# Patient Record
Sex: Female | Born: 1965 | Race: White | Hispanic: No | Marital: Single | Smoking: Never smoker
Health system: Southern US, Community
[De-identification: ages and names within clinical notes are randomized; demographics above are authoritative.]

## PROBLEM LIST (undated history)

## (undated) DIAGNOSIS — D649 Anemia, unspecified: Secondary | ICD-10-CM

## (undated) DIAGNOSIS — R87610 Atypical squamous cells of undetermined significance on cytologic smear of cervix (ASC-US): Secondary | ICD-10-CM

## (undated) DIAGNOSIS — K429 Umbilical hernia without obstruction or gangrene: Secondary | ICD-10-CM

## (undated) DIAGNOSIS — R87810 Cervical high risk human papillomavirus (HPV) DNA test positive: Secondary | ICD-10-CM

## (undated) DIAGNOSIS — N920 Excessive and frequent menstruation with regular cycle: Secondary | ICD-10-CM

## (undated) DIAGNOSIS — D219 Benign neoplasm of connective and other soft tissue, unspecified: Secondary | ICD-10-CM

## (undated) DIAGNOSIS — F419 Anxiety disorder, unspecified: Secondary | ICD-10-CM

## (undated) DIAGNOSIS — Z9289 Personal history of other medical treatment: Secondary | ICD-10-CM

## (undated) HISTORY — DX: Cervical high risk human papillomavirus (HPV) DNA test positive: R87.810

## (undated) HISTORY — DX: Umbilical hernia without obstruction or gangrene: K42.9

## (undated) HISTORY — DX: Personal history of other medical treatment: Z92.89

## (undated) HISTORY — DX: Anxiety disorder, unspecified: F41.9

## (undated) HISTORY — DX: Benign neoplasm of connective and other soft tissue, unspecified: D21.9

## (undated) HISTORY — DX: Excessive and frequent menstruation with regular cycle: N92.0

## (undated) HISTORY — DX: Anemia, unspecified: D64.9

## (undated) HISTORY — DX: Atypical squamous cells of undetermined significance on cytologic smear of cervix (ASC-US): R87.610

---

## 1987-05-17 DIAGNOSIS — O321XX0 Maternal care for breech presentation, not applicable or unspecified: Secondary | ICD-10-CM

## 1995-05-25 DIAGNOSIS — Z98891 History of uterine scar from previous surgery: Secondary | ICD-10-CM

## 2004-10-21 ENCOUNTER — Ambulatory Visit: Payer: Self-pay

## 2007-10-12 ENCOUNTER — Ambulatory Visit: Payer: Self-pay

## 2008-08-30 HISTORY — PX: COLPOSCOPY: SHX161

## 2008-08-30 HISTORY — PX: LEEP: SHX91

## 2008-08-30 HISTORY — PX: COLONOSCOPY: SHX174

## 2008-09-18 DIAGNOSIS — R87610 Atypical squamous cells of undetermined significance on cytologic smear of cervix (ASC-US): Secondary | ICD-10-CM

## 2008-09-18 HISTORY — DX: Atypical squamous cells of undetermined significance on cytologic smear of cervix (ASC-US): R87.610

## 2008-10-15 ENCOUNTER — Ambulatory Visit: Payer: Self-pay

## 2009-04-04 HISTORY — PX: CERVICAL BIOPSY  W/ LOOP ELECTRODE EXCISION: SUR135

## 2009-10-16 ENCOUNTER — Ambulatory Visit: Payer: Self-pay

## 2010-10-27 ENCOUNTER — Ambulatory Visit: Payer: Self-pay

## 2011-10-20 DIAGNOSIS — D649 Anemia, unspecified: Secondary | ICD-10-CM

## 2011-10-20 HISTORY — DX: Anemia, unspecified: D64.9

## 2011-11-30 ENCOUNTER — Ambulatory Visit: Payer: Self-pay

## 2012-08-30 DIAGNOSIS — D219 Benign neoplasm of connective and other soft tissue, unspecified: Secondary | ICD-10-CM

## 2012-08-30 HISTORY — DX: Benign neoplasm of connective and other soft tissue, unspecified: D21.9

## 2012-10-24 DIAGNOSIS — N920 Excessive and frequent menstruation with regular cycle: Secondary | ICD-10-CM

## 2012-10-24 HISTORY — DX: Excessive and frequent menstruation with regular cycle: N92.0

## 2012-10-25 DIAGNOSIS — K429 Umbilical hernia without obstruction or gangrene: Secondary | ICD-10-CM

## 2012-10-25 HISTORY — DX: Umbilical hernia without obstruction or gangrene: K42.9

## 2012-11-28 ENCOUNTER — Ambulatory Visit (INDEPENDENT_AMBULATORY_CARE_PROVIDER_SITE_OTHER): Payer: BC Managed Care – PPO | Admitting: General Surgery

## 2012-11-28 ENCOUNTER — Encounter: Payer: Self-pay | Admitting: General Surgery

## 2012-11-28 VITALS — BP 122/74 | HR 70 | Resp 12 | Ht 63.0 in | Wt 181.0 lb

## 2012-11-28 DIAGNOSIS — K420 Umbilical hernia with obstruction, without gangrene: Secondary | ICD-10-CM

## 2012-11-28 DIAGNOSIS — D259 Leiomyoma of uterus, unspecified: Secondary | ICD-10-CM

## 2012-11-28 DIAGNOSIS — K429 Umbilical hernia without obstruction or gangrene: Secondary | ICD-10-CM

## 2012-11-28 NOTE — Patient Instructions (Signed)
Hernia precautions and incarceration were discussed with the patient. If they develop symptoms of an incarcerated hernia, they were encouraged to seek prompt medical attention.  I have recommended repair of the hernia using mesh on an outpatient basis in the near future. The risk of infection was reviewed. The role of prosthetic mesh to minimize the risk of recurrence was reviewed. 

## 2012-11-28 NOTE — Progress Notes (Signed)
Patient ID: Andrea Lewis, female   DOB: 1966-03-13, 47 y.o.   MRN: [id number]  Chief Complaint  Patient presents with  . Umbilical Hernia    HPI Andrea Lewis is a 47 y.o. female here today for a umbilical hernia. Patient states is been there for 17 years and now it is getting bigger.  HPI  Past Medical History  Diagnosis Date  . Fibroid 2014    Past Surgical History  Procedure Laterality Date  . Cesarean section  1988, 1996  . Leep  2010  . Colonoscopy  2010    Family History  Problem Relation Age of Onset  . Adopted: Yes    Social History History  Substance Use Topics  . Smoking status: Never Smoker   . Smokeless tobacco: Never Used  . Alcohol Use: No    No Known Allergies  Current Outpatient Prescriptions  Medication Sig Dispense Refill  . Multiple Vitamin (MULTIVITAMIN) tablet Take 1 tablet by mouth daily.      Marland Kitchen NORTREL 1/35, 28, tablet Take 1 tablet by mouth daily.       No current facility-administered medications for this visit.    Review of Systems Review of Systems  Constitutional: Negative.   Respiratory: Negative.   Cardiovascular: Negative.     Blood pressure 122/74, pulse 70, resp. rate 12, height 5\' 3"  (1.6 m), weight 181 lb (82.101 kg), last menstrual period 11/07/2012.  Physical Exam Physical Exam  Constitutional: She appears well-developed and well-nourished.  Neck: Normal range of motion. Neck supple.  Cardiovascular: Normal rate, regular rhythm and normal heart sounds.   Pulmonary/Chest: Effort normal and breath sounds normal.  Abdominal: Soft. Normal appearance. A hernia is present. Hernia confirmed positive in the ventral area (sizeable umbilical hernia).  Genitourinary: Uterus is enlarged.    Data Reviewed HGB: 10.3 from GYN record.  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     Plan    Umbilical hernia repair at the time of hysterectomy as appropriate. The potential  for a mesh repair was reviewed. The patient has been encouraged by Dr. Tiburcio Pea to make use of hormonal manipulation to shrink the size of the leiomyoma prior to surgical intervention. Due to cost, this will likely be postponed until June.  Preop assessment will be required and will be coordinated with Dr. Tiburcio Pea.       Andrea Lewis 11/29/2012, 7:42 AM

## 2012-11-29 ENCOUNTER — Encounter: Payer: Self-pay | Admitting: General Surgery

## 2012-11-29 DIAGNOSIS — K429 Umbilical hernia without obstruction or gangrene: Secondary | ICD-10-CM | POA: Insufficient documentation

## 2012-11-29 DIAGNOSIS — D259 Leiomyoma of uterus, unspecified: Secondary | ICD-10-CM | POA: Insufficient documentation

## 2012-12-19 ENCOUNTER — Ambulatory Visit: Payer: Self-pay

## 2013-12-25 DIAGNOSIS — Z9289 Personal history of other medical treatment: Secondary | ICD-10-CM

## 2013-12-25 HISTORY — DX: Personal history of other medical treatment: Z92.89

## 2013-12-25 LAB — HM PAP SMEAR

## 2014-02-21 ENCOUNTER — Ambulatory Visit: Payer: Self-pay

## 2014-07-01 ENCOUNTER — Encounter: Payer: Self-pay | Admitting: General Surgery

## 2015-02-17 ENCOUNTER — Other Ambulatory Visit: Payer: Self-pay | Admitting: Obstetrics and Gynecology

## 2015-02-17 DIAGNOSIS — Z1231 Encounter for screening mammogram for malignant neoplasm of breast: Secondary | ICD-10-CM

## 2015-02-24 ENCOUNTER — Ambulatory Visit
Admission: RE | Admit: 2015-02-24 | Discharge: 2015-02-24 | Disposition: A | Discharge status: Home / Self Care | Payer: BC Managed Care – PPO | Source: Ambulatory Visit | Attending: Obstetrics and Gynecology | Admitting: Obstetrics and Gynecology

## 2015-02-24 DIAGNOSIS — Z1231 Encounter for screening mammogram for malignant neoplasm of breast: Secondary | ICD-10-CM | POA: Diagnosis not present

## 2016-02-26 ENCOUNTER — Other Ambulatory Visit: Payer: Self-pay | Admitting: Obstetrics and Gynecology

## 2016-02-26 DIAGNOSIS — Z1231 Encounter for screening mammogram for malignant neoplasm of breast: Secondary | ICD-10-CM

## 2016-03-12 ENCOUNTER — Ambulatory Visit
Admission: RE | Admit: 2016-03-12 | Discharge: 2016-03-12 | Disposition: A | Discharge status: Home / Self Care | Payer: BC Managed Care – PPO | Source: Ambulatory Visit | Attending: Obstetrics and Gynecology | Admitting: Obstetrics and Gynecology

## 2016-03-12 DIAGNOSIS — Z1231 Encounter for screening mammogram for malignant neoplasm of breast: Secondary | ICD-10-CM | POA: Diagnosis present

## 2016-03-12 LAB — HM MAMMOGRAPHY

## 2016-11-11 ENCOUNTER — Telehealth: Payer: Self-pay

## 2016-11-11 NOTE — Telephone Encounter (Signed)
Pt just scheduled AE 01/27/17. She is out of refills on her Nortel. She uses [facility] in [place].

## 2016-11-12 NOTE — Telephone Encounter (Signed)
Patient came into office about refill. Andrea Lewis said she will send it today. Please double check this has been done. Please advise.

## 2016-11-16 ENCOUNTER — Other Ambulatory Visit: Payer: Self-pay

## 2016-11-16 DIAGNOSIS — Z308 Encounter for other contraceptive management: Secondary | ICD-10-CM | POA: Insufficient documentation

## 2016-11-16 MED ORDER — NORETHINDRONE-ETH ESTRADIOL 1-35 MG-MCG PO TABS: 1.0000 | ORAL_TABLET | Freq: Every day | ORAL | 2 refills | Status: DC

## 2016-11-16 NOTE — Telephone Encounter (Signed)
Refill has been sent in, called pt but no answer and no voicemail set up to leave message .

## 2016-11-17 NOTE — Telephone Encounter (Signed)
Pt states she  got her [facility]

## 2017-01-27 ENCOUNTER — Encounter: Payer: Self-pay | Admitting: Obstetrics and Gynecology

## 2017-01-27 ENCOUNTER — Ambulatory Visit (INDEPENDENT_AMBULATORY_CARE_PROVIDER_SITE_OTHER): Payer: BC Managed Care – PPO | Admitting: Obstetrics and Gynecology

## 2017-01-27 VITALS — BP 112/66 | HR 75 | Ht 63.0 in | Wt 158.0 lb

## 2017-01-27 DIAGNOSIS — K429 Umbilical hernia without obstruction or gangrene: Secondary | ICD-10-CM

## 2017-01-27 DIAGNOSIS — Z1239 Encounter for other screening for malignant neoplasm of breast: Secondary | ICD-10-CM

## 2017-01-27 DIAGNOSIS — Z01419 Encounter for gynecological examination (general) (routine) without abnormal findings: Secondary | ICD-10-CM

## 2017-01-27 DIAGNOSIS — Z1151 Encounter for screening for human papillomavirus (HPV): Secondary | ICD-10-CM | POA: Diagnosis not present

## 2017-01-27 DIAGNOSIS — Z13220 Encounter for screening for lipoid disorders: Secondary | ICD-10-CM

## 2017-01-27 DIAGNOSIS — Z0000 Encounter for general adult medical examination without abnormal findings: Secondary | ICD-10-CM

## 2017-01-27 DIAGNOSIS — D219 Benign neoplasm of connective and other soft tissue, unspecified: Secondary | ICD-10-CM

## 2017-01-27 DIAGNOSIS — Z3041 Encounter for surveillance of contraceptive pills: Secondary | ICD-10-CM

## 2017-01-27 DIAGNOSIS — Z124 Encounter for screening for malignant neoplasm of cervix: Secondary | ICD-10-CM

## 2017-01-27 DIAGNOSIS — Z1231 Encounter for screening mammogram for malignant neoplasm of breast: Secondary | ICD-10-CM | POA: Diagnosis not present

## 2017-01-27 MED ORDER — NORETHINDRONE-ETH ESTRADIOL 1-35 MG-MCG PO TABS: 1.0000 | ORAL_TABLET | Freq: Every day | ORAL | 12 refills | Status: DC

## 2017-01-27 NOTE — Progress Notes (Signed)
Chief Complaint  Patient presents with  . Gynecologic Exam     HPI:      [name] is a 51 y.o. G2P2002 who LMP was No LMP recorded., presents today for her annual examination.  Her menses are absent due to continuous dosing of OCPs for cycle control.  Dysmenorrhea none. She does have occas intermenstrual bleeding. 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 She saw Dr. Kenton Kingfisher in 2014 who recommended lupron to shrink the fibroids before hyst, but that was too expensive. We ordered Lupron last yr with a coupon card but pt decided against using it. Her bleeding is controlled with OCPs. There is concern about the size of her uterus.    Sex activity: not sexually active.  Last Pap: December 25, 2013  Results were: no abnormalities /neg HPV DNA . 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  Last mammogram: March 12, 2016  Results were: normal--routine follow-up in 12 months FH unknown due to adoption. The patient does not do self-breast exams.  Tobacco use: The patient denies current or previous tobacco use. Alcohol use: none Exercise: not active  She does not get adequate calcium and Vitamin D in her diet.  She had borderline lipids, anemia, and Vit D deficiency on 2017 labs, but pt not interested in lipid meds, and doesn't remember Fe and Vit D supp. She has lost wt with wt watchers.  Past Medical History:  Diagnosis Date  . Anemia 10/20/2011   d/t chronic blood loss  . Anxiety   . ASCUS with positive high risk HPV cervical 09/18/2008   ascus; hpv +  . Fibroid 2014   c menorrhagia  . History of mammography, diagnostic 02/24/2015; 03/12/2016   BIRAD 2; NEG  . History of Papanicolaou smear of cervix 12/25/2013   -/-  . Menorrhagia 10/24/2012  . Umbilical hernia 37/85/8850   REDUCIBLE    Past Surgical History:  Procedure Laterality Date  . CERVICAL BIOPSY  W/ LOOP ELECTRODE EXCISION   04/04/2009  . [place]  . COLONOSCOPY  2010  . 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 LEEP  2010    Family History  Problem Relation Age of Onset  . Adopted: Yes    Social History   Social History  . Marital status: Single    Spouse name: N/A  . Number of children: N/A  . Years of education: N/A   Occupational History  . Not on file.   Social History Main Topics  . Smoking status: Never Smoker  . Smokeless tobacco: Never Used  . Alcohol use No  . Drug use: No  . Sexual activity: Not Currently    Birth control/ protection: Pill   Other Topics Concern  . Not on file   Social History Narrative  . No narrative on file     Current Outpatient Prescriptions:  Marland Kitchen  Multiple Vitamin (MULTIVITAMIN) tablet, Take 1 tablet by mouth daily., Disp: , Rfl:  .  norethindrone-ethinyl estradiol 1/35 (NORTREL 1/35, 28,) tablet, Take 1 tablet by mouth daily., Disp: 1 Package, Rfl: 12  ROS:  Review of Systems  Constitutional: Negative for fatigue, fever and unexpected weight change.  Respiratory: Negative for cough, shortness of breath and wheezing.   Cardiovascular: Negative for chest pain, palpitations and leg swelling.  Gastrointestinal: Positive for constipation. Negative for blood  in stool, diarrhea, nausea and vomiting.  Endocrine: Negative for cold intolerance, heat intolerance and polyuria.  Genitourinary: Negative for dyspareunia, dysuria, flank pain, frequency, genital sores, hematuria, menstrual problem, pelvic pain, urgency, vaginal bleeding, vaginal discharge and vaginal pain.  Musculoskeletal: Negative for back pain, joint swelling and myalgias.  Skin: Negative for rash.  Neurological: Negative for dizziness, syncope, light-headedness, numbness and headaches.  Hematological: Negative for adenopathy.  Psychiatric/Behavioral: Negative for agitation, confusion, sleep disturbance and suicidal ideas. The patient is not nervous/anxious.      Objective: BP 112/66  (BP Location: Left Arm, Patient Position: Sitting, Cuff Size: Normal)   Pulse 75   Ht 5\' 3"  (1.6 m)   Wt 158 lb (71.7 kg)   BMI 27.99 kg/m    Physical Exam  Constitutional: She is oriented to person, place, and time. She appears well-developed and well-nourished.  Genitourinary: Vagina normal. There is no rash or tenderness on the right labia. There is no rash or tenderness on the left labia. No erythema or tenderness in the vagina. No vaginal discharge found. Right adnexum does not display mass and does not display tenderness. Left adnexum does not display mass and does not display tenderness. Cervix does not exhibit motion tenderness or polyp.   Uterus is enlarged, exhibiting a mass and irregular. Uterus is not tender.  Neck: Normal range of motion. No thyromegaly present.  Cardiovascular: Normal rate, regular rhythm and normal heart sounds.   No murmur heard. Pulmonary/Chest: Effort normal and breath sounds normal. Right breast exhibits no mass, no nipple discharge, no skin change and no tenderness. Left breast exhibits no mass, no nipple discharge, no skin change and no tenderness.  Abdominal: There is no tenderness. There is no guarding. A hernia is present. Hernia confirmed positive in the ventral area.    Musculoskeletal: Normal range of motion.  Neurological: She is alert and oriented to person, place, and time. No cranial nerve deficit.  Psychiatric: She has a normal mood and affect. Her behavior is normal.  Vitals reviewed.   Assessment/Plan: Encounter for annual routine gynecological examination  Cervical cancer screening - Plan: IGP, Aptima HPV  Screening for HPV (human papillomavirus) - Plan: IGP, Aptima HPV  Screening for breast cancer - Pt to sched mammo. - Plan: MM DIGITAL SCREENING BILATERAL  Encounter for surveillance of contraceptive pills - OCP RF eRxd. Continue continuous dosing for cycle control. - Plan: norethindrone-ethinyl estradiol 1/35 (NORTREL 1/35, 28,)  tablet  Screening cholesterol level - Will f/u with results. Pt has lost wt with diet changes.  Blood tests for routine general physical examination - Plan: CBC with Differential/Platelet, Lipid panel  Leiomyoma - Check u/s for size/stability. Pt encouraged to have removed or try lupron to decrease size in past, but pt not interested.  - Plan: US Transvaginal Non-OB  Umbilical hernia without obstruction and without gangrene - Pt encouraged to have repaired but not interested.              GYN counsel mammography screening, adequate intake of calcium and vitamin D, diet and exercise     F/U  Return in about 1 week (around 02/03/2017) for GYN u/s for leio--ABC to call pt.  Takeshi Teasdale B. Kitzia Camus, PA-C 01/27/2017 5:22 PM

## 2017-01-28 ENCOUNTER — Telehealth: Payer: Self-pay | Admitting: Obstetrics and Gynecology

## 2017-01-28 NOTE — Telephone Encounter (Signed)
Pt is schedule 02/04/17

## 2017-01-28 NOTE — Telephone Encounter (Signed)
-----   Message from Chad Cordial, PA-C sent at 01/27/2017  5:18 PM EDT ----- Regarding: U/S appt Can you pls call pt to sched GYN u/s appt (forgot to mention when she checked out). Pt also will do fasting labs that day so sched in AM. ABC to call pt with results. Thank you.

## 2017-02-02 LAB — IGP, APTIMA HPV
HPV Aptima: NEGATIVE
PAP Smear Comment: 0

## 2017-02-04 ENCOUNTER — Other Ambulatory Visit: Payer: BC Managed Care – PPO

## 2017-02-10 ENCOUNTER — Ambulatory Visit (INDEPENDENT_AMBULATORY_CARE_PROVIDER_SITE_OTHER): Payer: BC Managed Care – PPO

## 2017-02-10 ENCOUNTER — Other Ambulatory Visit: Payer: BC Managed Care – PPO

## 2017-02-10 ENCOUNTER — Telehealth: Payer: Self-pay | Admitting: Obstetrics and Gynecology

## 2017-02-10 DIAGNOSIS — Z0000 Encounter for general adult medical examination without abnormal findings: Secondary | ICD-10-CM

## 2017-02-10 DIAGNOSIS — D219 Benign neoplasm of connective and other soft tissue, unspecified: Secondary | ICD-10-CM | POA: Diagnosis not present

## 2017-02-10 NOTE — Telephone Encounter (Signed)
Pt aware of large stable fibroid on u/s. No real change in size. 6.4 x 8.4 x 8 cm. Pt doesn't want to have surg even though that is recommendation. Follow expectantly.

## 2017-02-11 LAB — LIPID PANEL
Chol/HDL Ratio: 4 ratio (ref 0.0–4.4)
Cholesterol, Total: 182 mg/dL (ref 100–199)
HDL: 46 mg/dL (ref >39)
LDL Calculated: 108 mg/dL — ABNORMAL HIGH (ref 0–99)
Triglycerides: 141 mg/dL (ref 0–149)
VLDL Cholesterol Cal: 28 mg/dL (ref 5–40)

## 2017-02-11 LAB — CBC WITH DIFFERENTIAL/PLATELET
BASOS ABS: 0 10*3/uL (ref 0.0–0.2)
Basos: 1 %
EOS (ABSOLUTE): 0.2 10*3/uL (ref 0.0–0.4)
Eos: 5 %
Hematocrit: 35.4 % (ref 34.0–46.6)
Hemoglobin: 10.8 g/dL — ABNORMAL LOW (ref 11.1–15.9)
IMMATURE GRANULOCYTES: 0 %
Immature Grans (Abs): 0 10*3/uL (ref 0.0–0.1)
LYMPHS: 36 %
Lymphocytes Absolute: 1.5 10*3/uL (ref 0.7–3.1)
MCH: 25.6 pg — ABNORMAL LOW (ref 26.6–33.0)
MCHC: 30.5 g/dL — ABNORMAL LOW (ref 31.5–35.7)
MCV: 84 fL (ref 79–97)
MONOS ABS: 0.4 10*3/uL (ref 0.1–0.9)
Monocytes: 11 %
NEUTROS PCT: 47 %
Neutrophils Absolute: 1.9 10*3/uL (ref 1.4–7.0)
PLATELETS: 287 10*3/uL (ref 150–379)
RBC: 4.22 x10E6/uL (ref 3.77–5.28)
RDW: 15.8 % — AB (ref 12.3–15.4)
WBC: 4 10*3/uL (ref 3.4–10.8)

## 2017-02-11 NOTE — Progress Notes (Signed)
Fibroids increasing in size.  Still recommend Lupron and/or surgery.  Review of ULTRASOUND.    I have personally reviewed images and report of recent ultrasound done at [facility].    Plan of management to be discussed with patient.  Barnett Applebaum, MD, [facility], [facility] 02/11/2017  7:58 AM

## 2017-02-24 ENCOUNTER — Other Ambulatory Visit: Payer: Self-pay | Admitting: Obstetrics and Gynecology

## 2017-02-25 ENCOUNTER — Other Ambulatory Visit: Payer: Self-pay | Admitting: Obstetrics and Gynecology

## 2017-02-25 MED ORDER — ONE-DAILY MULTI VITAMINS PO TABS: 1.0000 | ORAL_TABLET | Freq: Every day | ORAL | 4 refills | Status: AC

## 2017-02-25 NOTE — Telephone Encounter (Signed)
Pt called for refill on Nortrell.  Call [facility] [place].  They have refills on file.  UTR pt.  [phone].

## 2017-02-25 NOTE — Telephone Encounter (Signed)
Pharm called for verification of nortrel dose.  Pt states it's supposed to be 0.5/35 and it gets mixed up all the time.  Last note says 1/35.  Adv pharm of this but pt is adamant that it is 0.5/35.  Adv pharm to dispense 0.5/35.   [facility] has 0.5/35 as well.

## 2017-03-16 ENCOUNTER — Ambulatory Visit
Admission: RE | Admit: 2017-03-16 | Discharge: 2017-03-16 | Disposition: A | Discharge status: Home / Self Care | Payer: BC Managed Care – PPO | Source: Ambulatory Visit | Attending: Obstetrics and Gynecology | Admitting: Obstetrics and Gynecology

## 2017-03-16 DIAGNOSIS — Z1231 Encounter for screening mammogram for malignant neoplasm of breast: Secondary | ICD-10-CM | POA: Diagnosis not present

## 2017-03-16 DIAGNOSIS — Z1239 Encounter for other screening for malignant neoplasm of breast: Secondary | ICD-10-CM

## 2017-03-17 ENCOUNTER — Encounter: Payer: Self-pay | Admitting: Obstetrics and Gynecology

## 2017-06-20 ENCOUNTER — Telehealth: Payer: Self-pay | Admitting: *Deleted

## 2017-06-20 ENCOUNTER — Encounter
Admission: EM | Disposition: A | Discharge status: Home / Self Care | Payer: Self-pay | Source: Home / Self Care | Attending: Emergency Medicine

## 2017-06-20 ENCOUNTER — Telehealth: Payer: Self-pay

## 2017-06-20 ENCOUNTER — Emergency Department: Payer: BC Managed Care – PPO | Admitting: Anesthesiology

## 2017-06-20 ENCOUNTER — Encounter: Payer: Self-pay | Admitting: Emergency Medicine

## 2017-06-20 ENCOUNTER — Emergency Department: Payer: BC Managed Care – PPO

## 2017-06-20 ENCOUNTER — Observation Stay
Admission: EM | Admit: 2017-06-20 | Discharge: 2017-06-21 | Disposition: A | Discharge status: Home / Self Care | Payer: BC Managed Care – PPO | Attending: Surgery | Admitting: Surgery

## 2017-06-20 DIAGNOSIS — D259 Leiomyoma of uterus, unspecified: Secondary | ICD-10-CM | POA: Insufficient documentation

## 2017-06-20 DIAGNOSIS — Z9889 Other specified postprocedural states: Secondary | ICD-10-CM | POA: Insufficient documentation

## 2017-06-20 DIAGNOSIS — K420 Umbilical hernia with obstruction, without gangrene: Principal | ICD-10-CM | POA: Insufficient documentation

## 2017-06-20 DIAGNOSIS — Z793 Long term (current) use of hormonal contraceptives: Secondary | ICD-10-CM | POA: Diagnosis not present

## 2017-06-20 DIAGNOSIS — K429 Umbilical hernia without obstruction or gangrene: Secondary | ICD-10-CM

## 2017-06-20 DIAGNOSIS — K8020 Calculus of gallbladder without cholecystitis without obstruction: Secondary | ICD-10-CM | POA: Insufficient documentation

## 2017-06-20 DIAGNOSIS — R112 Nausea with vomiting, unspecified: Secondary | ICD-10-CM | POA: Diagnosis present

## 2017-06-20 DIAGNOSIS — R932 Abnormal findings on diagnostic imaging of liver and biliary tract: Secondary | ICD-10-CM | POA: Insufficient documentation

## 2017-06-20 HISTORY — PX: UMBILICAL HERNIA REPAIR: SHX196

## 2017-06-20 LAB — URINALYSIS, COMPLETE (UACMP) WITH MICROSCOPIC
BACTERIA UA: NONE SEEN
Bilirubin Urine: NEGATIVE
Glucose, UA: NEGATIVE mg/dL
Ketones, ur: 5 mg/dL — AB
Leukocytes, UA: NEGATIVE
Nitrite: NEGATIVE
Protein, ur: 100 mg/dL — AB
SPECIFIC GRAVITY, URINE: 1.032 — AB (ref 1.005–1.030)
pH: 5 (ref 5.0–8.0)

## 2017-06-20 LAB — CBC
HEMATOCRIT: 44.8 % (ref 35.0–47.0)
Hemoglobin: 14.5 g/dL (ref 12.0–16.0)
MCH: 26.6 pg (ref 26.0–34.0)
MCHC: 32.4 g/dL (ref 32.0–36.0)
MCV: 81.9 fL (ref 80.0–100.0)
Platelets: 387 10*3/uL (ref 150–440)
RBC: 5.47 MIL/uL — ABNORMAL HIGH (ref 3.80–5.20)
RDW: 15.7 % — AB (ref 11.5–14.5)
WBC: 10.3 10*3/uL (ref 3.6–11.0)

## 2017-06-20 LAB — COMPREHENSIVE METABOLIC PANEL
ALBUMIN: 4.1 g/dL (ref 3.5–5.0)
ALT: 16 U/L (ref 14–54)
AST: 19 U/L (ref 15–41)
Alkaline Phosphatase: 62 U/L (ref 38–126)
Anion gap: 12 (ref 5–15)
BUN: 23 mg/dL — AB (ref 6–20)
CHLORIDE: 102 mmol/L (ref 101–111)
CO2: 26 mmol/L (ref 22–32)
Calcium: 10.2 mg/dL (ref 8.9–10.3)
Creatinine, Ser: 0.76 mg/dL (ref 0.44–1.00)
GFR calc Af Amer: >60 mL/min (ref >60)
GFR calc non Af Amer: >60 mL/min (ref >60)
GLUCOSE: 128 mg/dL — AB (ref 65–99)
POTASSIUM: 4.9 mmol/L (ref 3.5–5.1)
Sodium: 140 mmol/L (ref 135–145)
Total Bilirubin: 0.9 mg/dL (ref 0.3–1.2)
Total Protein: 8.3 g/dL — ABNORMAL HIGH (ref 6.5–8.1)

## 2017-06-20 LAB — PROTIME-INR
INR: 1.09
PROTHROMBIN TIME: 14 s (ref 11.4–15.2)

## 2017-06-20 LAB — LIPASE, BLOOD: LIPASE: 20 U/L (ref 11–51)

## 2017-06-20 LAB — PREGNANCY, URINE: Preg Test, Ur: NEGATIVE

## 2017-06-20 LAB — APTT: aPTT: <24 seconds — ABNORMAL LOW (ref 24–36)

## 2017-06-20 LAB — TYPE AND SCREEN
ABO/RH(D): A POS
Antibody Screen: NEGATIVE

## 2017-06-20 SURGERY — REPAIR, HERNIA, UMBILICAL, ADULT
Anesthesia: General | Site: Abdomen | Wound class: Clean

## 2017-06-20 MED ORDER — BUPIVACAINE LIPOSOME 1.3 % IJ SUSP
INTRAMUSCULAR | Status: AC
  Filled 2017-06-20: qty 20

## 2017-06-20 MED ORDER — ROCURONIUM BROMIDE 50 MG/5ML IV SOLN
INTRAVENOUS | Status: AC
  Filled 2017-06-20: qty 1

## 2017-06-20 MED ORDER — MIDAZOLAM HCL 2 MG/2ML IJ SOLN
INTRAMUSCULAR | Status: AC
  Filled 2017-06-20: qty 2

## 2017-06-20 MED ORDER — FENTANYL CITRATE (PF) 100 MCG/2ML IJ SOLN
INTRAMUSCULAR | Status: AC
  Filled 2017-06-20: qty 2

## 2017-06-20 MED ORDER — DEXAMETHASONE SODIUM PHOSPHATE 10 MG/ML IJ SOLN
INTRAMUSCULAR | Status: AC
  Filled 2017-06-20: qty 1

## 2017-06-20 MED ORDER — LIDOCAINE HCL (PF) 2 % IJ SOLN
INTRAMUSCULAR | Status: AC
  Filled 2017-06-20: qty 10

## 2017-06-20 MED ORDER — SUCCINYLCHOLINE CHLORIDE 20 MG/ML IJ SOLN
INTRAMUSCULAR | Status: AC
  Filled 2017-06-20: qty 1

## 2017-06-20 MED ORDER — KETOROLAC TROMETHAMINE 30 MG/ML IJ SOLN
INTRAMUSCULAR | Status: AC
  Filled 2017-06-20: qty 1

## 2017-06-20 MED ORDER — CEFAZOLIN SODIUM-DEXTROSE 2-4 GM/100ML-% IV SOLN
2.0000 g | INTRAVENOUS | Status: AC
  Administered 2017-06-20: 2 g via INTRAVENOUS

## 2017-06-20 MED ORDER — ACETAMINOPHEN 500 MG PO TABS
1000.0000 mg | ORAL_TABLET | Freq: Four times a day (QID) | ORAL | Status: DC
  Administered 2017-06-21 (×3): 1000 mg via ORAL
  Filled 2017-06-20 (×3): qty 2

## 2017-06-20 MED ORDER — ONDANSETRON HCL 4 MG/2ML IJ SOLN
INTRAMUSCULAR | Status: DC | PRN
Start: 2017-06-20 — End: 2017-06-20
  Administered 2017-06-20: 4 mg via INTRAVENOUS

## 2017-06-20 MED ORDER — PANTOPRAZOLE SODIUM 40 MG IV SOLR
40.0000 mg | Freq: Every day | INTRAVENOUS | Status: DC
  Administered 2017-06-20: 40 mg via INTRAVENOUS
  Filled 2017-06-20 (×3): qty 40

## 2017-06-20 MED ORDER — IOPAMIDOL (ISOVUE-300) INJECTION 61%: 30.0000 mL | Freq: Once | INTRAVENOUS | Status: DC | PRN

## 2017-06-20 MED ORDER — BUPIVACAINE HCL (PF) 0.5 % IJ SOLN
INTRAMUSCULAR | Status: AC
  Filled 2017-06-20: qty 30

## 2017-06-20 MED ORDER — PROPOFOL 10 MG/ML IV BOLUS
INTRAVENOUS | Status: DC | PRN
  Administered 2017-06-20: 150 mg via INTRAVENOUS

## 2017-06-20 MED ORDER — SODIUM CHLORIDE 0.9 % IV BOLUS (SEPSIS)
1000.0000 mL | Freq: Once | INTRAVENOUS | Status: AC
  Administered 2017-06-20: 1000 mL via INTRAVENOUS

## 2017-06-20 MED ORDER — HYDROMORPHONE HCL 1 MG/ML IJ SOLN
0.5000 mg | Freq: Once | INTRAMUSCULAR | Status: AC
  Administered 2017-06-20: 0.5 mg via INTRAVENOUS

## 2017-06-20 MED ORDER — LIDOCAINE HCL (CARDIAC) 20 MG/ML IV SOLN
INTRAVENOUS | Status: DC | PRN
  Administered 2017-06-20: 60 mg via INTRAVENOUS

## 2017-06-20 MED ORDER — ONDANSETRON 4 MG PO TBDP: 4.0000 mg | ORAL_TABLET | Freq: Four times a day (QID) | ORAL | Status: DC | PRN

## 2017-06-20 MED ORDER — CHLORHEXIDINE GLUCONATE CLOTH 2 % EX PADS: 6.0000 | MEDICATED_PAD | Freq: Once | CUTANEOUS | Status: DC

## 2017-06-20 MED ORDER — POLYETHYLENE GLYCOL 3350 17 G PO PACK
17.0000 g | PACK | Freq: Every day | ORAL | Status: DC | PRN
  Filled 2017-06-20: qty 1

## 2017-06-20 MED ORDER — IOPAMIDOL (ISOVUE-300) INJECTION 61%
100.0000 mL | Freq: Once | INTRAVENOUS | Status: AC | PRN
  Administered 2017-06-20: 100 mL via INTRAVENOUS

## 2017-06-20 MED ORDER — FENTANYL CITRATE (PF) 100 MCG/2ML IJ SOLN
INTRAMUSCULAR | Status: DC | PRN
  Administered 2017-06-20: 100 ug via INTRAVENOUS
  Administered 2017-06-20: 25 ug via INTRAVENOUS
  Administered 2017-06-20: 50 ug via INTRAVENOUS
  Administered 2017-06-20: 25 ug via INTRAVENOUS
  Administered 2017-06-20: 50 ug via INTRAVENOUS

## 2017-06-20 MED ORDER — BUPIVACAINE LIPOSOME 1.3 % IJ SUSP
INTRAMUSCULAR | Status: DC | PRN
  Administered 2017-06-20: 20 mL

## 2017-06-20 MED ORDER — CEFAZOLIN SODIUM 1 G IJ SOLR
INTRAMUSCULAR | Status: AC
  Filled 2017-06-20: qty 20

## 2017-06-20 MED ORDER — EPHEDRINE SULFATE 50 MG/ML IJ SOLN
INTRAMUSCULAR | Status: AC
  Filled 2017-06-20: qty 1

## 2017-06-20 MED ORDER — SUGAMMADEX SODIUM 200 MG/2ML IV SOLN
INTRAVENOUS | Status: DC | PRN
  Administered 2017-06-20: 150 mg via INTRAVENOUS

## 2017-06-20 MED ORDER — DEXAMETHASONE SODIUM PHOSPHATE 10 MG/ML IJ SOLN
INTRAMUSCULAR | Status: DC | PRN
  Administered 2017-06-20: 10 mg via INTRAVENOUS

## 2017-06-20 MED ORDER — FENTANYL CITRATE (PF) 100 MCG/2ML IJ SOLN: 25.0000 ug | INTRAMUSCULAR | Status: DC | PRN

## 2017-06-20 MED ORDER — LACTATED RINGERS IV SOLN
INTRAVENOUS | Status: DC | PRN
  Administered 2017-06-20 (×2): via INTRAVENOUS

## 2017-06-20 MED ORDER — ONDANSETRON HCL 4 MG/2ML IJ SOLN
INTRAMUSCULAR | Status: AC
  Filled 2017-06-20: qty 2

## 2017-06-20 MED ORDER — SUGAMMADEX SODIUM 200 MG/2ML IV SOLN
INTRAVENOUS | Status: AC
  Filled 2017-06-20: qty 2

## 2017-06-20 MED ORDER — ACETAMINOPHEN 10 MG/ML IV SOLN
INTRAVENOUS | Status: AC
  Filled 2017-06-20: qty 100

## 2017-06-20 MED ORDER — LACTATED RINGERS IV SOLN
125.0000 mL/h | INTRAVENOUS | Status: DC
  Administered 2017-06-20: 125 mL/h via INTRAVENOUS

## 2017-06-20 MED ORDER — ACETAMINOPHEN 10 MG/ML IV SOLN
INTRAVENOUS | Status: DC | PRN
  Administered 2017-06-20: 1000 mg via INTRAVENOUS

## 2017-06-20 MED ORDER — PHENYLEPHRINE HCL 10 MG/ML IJ SOLN
INTRAMUSCULAR | Status: DC | PRN
  Administered 2017-06-20 (×2): 100 ug via INTRAVENOUS

## 2017-06-20 MED ORDER — KETOROLAC TROMETHAMINE 30 MG/ML IJ SOLN
INTRAMUSCULAR | Status: DC | PRN
  Administered 2017-06-20: 30 mg via INTRAVENOUS

## 2017-06-20 MED ORDER — ROCURONIUM BROMIDE 100 MG/10ML IV SOLN
INTRAVENOUS | Status: DC | PRN
  Administered 2017-06-20: 30 mg via INTRAVENOUS
  Administered 2017-06-20: 20 mg via INTRAVENOUS

## 2017-06-20 MED ORDER — PHENYLEPHRINE HCL 10 MG/ML IJ SOLN
INTRAMUSCULAR | Status: AC
  Filled 2017-06-20: qty 1

## 2017-06-20 MED ORDER — ENOXAPARIN SODIUM 40 MG/0.4ML ~~LOC~~ SOLN
40.0000 mg | SUBCUTANEOUS | Status: DC
  Administered 2017-06-21: 40 mg via SUBCUTANEOUS
  Filled 2017-06-20 (×3): qty 0.4

## 2017-06-20 MED ORDER — PROMETHAZINE HCL 25 MG/ML IJ SOLN: 6.2500 mg | INTRAMUSCULAR | Status: DC | PRN

## 2017-06-20 MED ORDER — ONDANSETRON HCL 4 MG/2ML IJ SOLN: 4.0000 mg | Freq: Four times a day (QID) | INTRAMUSCULAR | Status: DC | PRN

## 2017-06-20 MED ORDER — HYDROMORPHONE HCL 1 MG/ML IJ SOLN
0.5000 mg | Freq: Once | INTRAMUSCULAR | Status: AC
  Administered 2017-06-20: 0.5 mg via INTRAVENOUS
  Filled 2017-06-20: qty 1

## 2017-06-20 MED ORDER — KETOROLAC TROMETHAMINE 30 MG/ML IJ SOLN
30.0000 mg | Freq: Four times a day (QID) | INTRAMUSCULAR | Status: DC
  Administered 2017-06-21 (×3): 30 mg via INTRAVENOUS
  Filled 2017-06-20 (×10): qty 1

## 2017-06-20 MED ORDER — HYDROMORPHONE HCL 1 MG/ML IJ SOLN
INTRAMUSCULAR | Status: AC
  Administered 2017-06-20: 0.5 mg via INTRAVENOUS
  Filled 2017-06-20: qty 1

## 2017-06-20 MED ORDER — HYDROMORPHONE HCL 1 MG/ML IJ SOLN: 0.5000 mg | INTRAMUSCULAR | Status: DC | PRN

## 2017-06-20 MED ORDER — SEVOFLURANE IN SOLN
RESPIRATORY_TRACT | Status: AC
  Filled 2017-06-20: qty 250

## 2017-06-20 MED ORDER — EPHEDRINE SULFATE 50 MG/ML IJ SOLN
INTRAMUSCULAR | Status: DC | PRN
  Administered 2017-06-20 (×2): 5 mg via INTRAVENOUS

## 2017-06-20 MED ORDER — PROPOFOL 10 MG/ML IV BOLUS
INTRAVENOUS | Status: AC
  Filled 2017-06-20: qty 20

## 2017-06-20 MED ORDER — ONDANSETRON HCL 4 MG/2ML IJ SOLN
4.0000 mg | Freq: Once | INTRAMUSCULAR | Status: AC
  Administered 2017-06-20: 4 mg via INTRAVENOUS
  Filled 2017-06-20: qty 2

## 2017-06-20 MED ORDER — HYDROMORPHONE HCL 1 MG/ML IJ SOLN
INTRAMUSCULAR | Status: AC
  Filled 2017-06-20: qty 1

## 2017-06-20 MED ORDER — MIDAZOLAM HCL 2 MG/2ML IJ SOLN
INTRAMUSCULAR | Status: DC | PRN
  Administered 2017-06-20: 2 mg via INTRAVENOUS

## 2017-06-20 MED ORDER — OXYCODONE HCL 5 MG PO TABS: 5.0000 mg | ORAL_TABLET | ORAL | Status: DC | PRN

## 2017-06-20 MED ORDER — BUPIVACAINE-EPINEPHRINE (PF) 0.5% -1:200000 IJ SOLN
INTRAMUSCULAR | Status: DC | PRN
  Administered 2017-06-20: 30 mL

## 2017-06-20 MED ORDER — SUCCINYLCHOLINE CHLORIDE 20 MG/ML IJ SOLN
INTRAMUSCULAR | Status: DC | PRN
  Administered 2017-06-20: 80 mg via INTRAVENOUS

## 2017-06-20 SURGICAL SUPPLY — 36 items
BLADE SURG 15 STRL LF DISP TIS (BLADE) ×1 IMPLANT
BLADE SURG 15 STRL SS (BLADE) ×1
CANISTER SUCT 1200ML W/VALVE (MISCELLANEOUS) ×2 IMPLANT
CHLORAPREP W/TINT 26ML (MISCELLANEOUS) ×2 IMPLANT
CNTNR SPEC 2.5X3XGRAD LEK (MISCELLANEOUS) ×1
CONT SPEC 4OZ STER OR WHT (MISCELLANEOUS) ×1
CONTAINER SPEC 2.5X3XGRAD LEK (MISCELLANEOUS) ×1 IMPLANT
CRADLE LAMINECT ARM (MISCELLANEOUS) ×2 IMPLANT
DERMABOND ADVANCED (GAUZE/BANDAGES/DRESSINGS)
DERMABOND ADVANCED .7 DNX12 (GAUZE/BANDAGES/DRESSINGS) IMPLANT
DRAPE PED LAPAROTOMY (DRAPES) ×2 IMPLANT
DRSG OPSITE POSTOP 4X8 (GAUZE/BANDAGES/DRESSINGS) ×2 IMPLANT
ELECT REM PT RETURN 9FT ADLT (ELECTROSURGICAL) ×2
ELECTRODE REM PT RTRN 9FT ADLT (ELECTROSURGICAL) ×1 IMPLANT
GLOVE BIO SURGEON STRL SZ7 (GLOVE) ×4 IMPLANT
GLOVE BIOGEL PI IND STRL 7.0 (GLOVE) ×2 IMPLANT
GLOVE BIOGEL PI INDICATOR 7.0 (GLOVE) ×2
GLOVE SURG SYN 7.0 (GLOVE) ×2 IMPLANT
GLOVE SURG SYN 7.5  E (GLOVE) ×1
GLOVE SURG SYN 7.5 E (GLOVE) ×1 IMPLANT
GOWN STRL REUS W/ TWL LRG LVL3 (GOWN DISPOSABLE) ×3 IMPLANT
GOWN STRL REUS W/TWL LRG LVL3 (GOWN DISPOSABLE) ×3
HANDLE SUCTION POOLE (INSTRUMENTS) ×1 IMPLANT
NEEDLE HYPO 22GX1.5 SAFETY (NEEDLE) ×2 IMPLANT
NS IRRIG 1000ML POUR BTL (IV SOLUTION) ×2 IMPLANT
NS IRRIG 500ML POUR BTL (IV SOLUTION) ×2 IMPLANT
PACK BASIN MINOR ARMC (MISCELLANEOUS) ×2 IMPLANT
STAPLER SKIN PROX 35W (STAPLE) ×2 IMPLANT
SUCTION POOLE HANDLE (INSTRUMENTS) ×2
SUT ETHIBOND 0 MO6 C/R (SUTURE) ×2 IMPLANT
SUT MNCRL AB 4-0 PS2 18 (SUTURE) IMPLANT
SUT SILK 0 (SUTURE) ×1
SUT SILK 0 30XBRD TIE 6 (SUTURE) ×1 IMPLANT
SUT VIC AB 3-0 SH 27 (SUTURE) ×1
SUT VIC AB 3-0 SH 27X BRD (SUTURE) ×1 IMPLANT
SYR 20CC LL (SYRINGE) ×2 IMPLANT

## 2017-06-20 NOTE — ED Notes (Signed)
Pt removed all clothing and jewelry. Belongings placed in pt belongings bag and sent with pt to OR.

## 2017-06-20 NOTE — Progress Notes (Signed)
Preoperative Review   Patient was met in the preoperative holding area. The history is reviewed in the chart and with the patient. I personally reviewed the options and rationale as well as the risks of this procedure that have been previously discussed with the patient. All questions asked by the patient and/or family were answered to their satisfaction.  Patient to undergo an umbilical hernia repair.  Patient agrees to proceed with this procedure at this time.  Melvyn Neth, MD

## 2017-06-20 NOTE — Telephone Encounter (Signed)
Pt's hernia has popped out and she is unable to pop it back in.  She is unable to eat or drink anything b/c it makes her sick, has cold chills, hot flashes, no fever.  Dr. Bary Castilla can't see her until Mon.  (please see phone msg from today toByrnett's office) [phone]

## 2017-06-20 NOTE — Anesthesia Preprocedure Evaluation (Signed)
Anesthesia Evaluation  Patient identified by MRN, date of birth, ID band Patient awake    Reviewed: Allergy & Precautions, H&P , NPO status , Patient's Chart, lab work & pertinent test results, reviewed documented beta blocker date and time   History of Anesthesia Complications Negative for: history of anesthetic complications  Airway Mallampati: II  TM Distance: >3 FB Neck ROM: full    Dental  (+) Dental Advidsory Given, Teeth Intact, Chipped, Missing   Pulmonary neg pulmonary ROS,           Cardiovascular Exercise Tolerance: Good negative cardio ROS       Neuro/Psych negative neurological ROS  negative psych ROS   GI/Hepatic negative GI ROS, Neg liver ROS,   Endo/Other  negative endocrine ROS  Renal/GU negative Renal ROS  negative genitourinary   Musculoskeletal   Abdominal   Peds  Hematology negative hematology ROS (+)   Anesthesia Other Findings Past Medical History: 10/20/2011: Anemia     Comment:  d/t chronic blood loss No date: Anxiety 09/18/2008: ASCUS with positive high risk HPV cervical     Comment:  ascus; hpv + 2014: Fibroid     Comment:  c menorrhagia 02/24/2015; 03/12/2016: History of mammography, diagnostic     Comment:  BIRAD 2; NEG 12/25/2013: History of Papanicolaou smear of cervix     Comment:  -/- 10/24/2012: Menorrhagia 37/34/2876: Umbilical hernia     Comment:  REDUCIBLE   Reproductive/Obstetrics negative OB ROS                             Anesthesia Physical Anesthesia Plan  ASA: I  Anesthesia Plan: General   Post-op Pain Management:    Induction: Intravenous  PONV Risk Score and Plan: 3 and Ondansetron, Dexamethasone and Midazolam  Airway Management Planned: Oral ETT  Additional Equipment:   Intra-op Plan:   Post-operative Plan: Extubation in OR  Informed Consent: I have reviewed the patients History and Physical, chart, labs and  discussed the procedure including the risks, benefits and alternatives for the proposed anesthesia with the patient or authorized representative who has indicated his/her understanding and acceptance.   Dental Advisory Given  Plan Discussed with: Anesthesiologist, CRNA and Surgeon  Anesthesia Plan Comments:         Anesthesia Quick Evaluation

## 2017-06-20 NOTE — Op Note (Signed)
Procedure Date:  06/20/2017  Pre-operative Diagnosis:  Incarcerated Umbilical Hernia  Post-operative Diagnosis:  Incarcerated Umbilical Hernia  Procedure:  Umbilical hernia repair  Surgeon:  Melvyn Neth, MD  Anesthesia:  General endotracheal  Estimated Blood Loss:  20 ml  Specimens:  Omentum, hernia sac  Complications:  None  Indications for Procedure:  This is a 51 y.o. female who presents with an incarcerated umbilical hernia, unable to reduce at bedside.  The risks of bleeding, abscess or infection, injury to surrounding structures, and need for further procedures were all discussed with the patient and was willing to proceed.  Description of Procedure: The patient was correctly identified in the preoperative area and brought into the operating room.  The patient was placed supine with VTE prophylaxis in place.  Appropriate time-outs were performed.  Anesthesia was induced and the patient was intubated.  Appropriate antibiotics were infused.  The abdomen was prepped and draped in a sterile fashion. A vertical midline incision was made around the umbilical hernia.  Cautery was used to dissect down the subcutaneous tissue onto the hernia sac.  Careful dissection was needed to separate the layers of scar tissue joining the hernia sac to the overlying skin.  The umbilical stalk was identified and carefully dissected and separated.  The hernia sac was then incised, revealing omentum and a small segment of small bowel.  This segment was hyperemic but viable.  The hernia sac was excised.  The fascial defect was approximately 2 cm in length, and this was lengthened by 1.5 cm in order to facilitate reduction of the hernia contents.  For this, partial omentectomy was also performed using Kelly clamps and 0 silk ties.  The remaining omentum and the segment of small bowel were reduced without complications.  The bowel remained viable and the hyperemia improved.  The fascial edges were cleaned and  the fascia was closed using 0 Ethibond sutures.  No mesh was needed.  The cavity was thoroughly irrigated.  50 ml of Exparel solution were infiltrated into the fascia and subcutaneous tissue.  The wound was then closed in layers using 3-0 Vicryl.  The umbilical stalk was also sutured to the fascia using 3-0 Vicryl.  The skin was then closed with staples.  The wound was cleaned and dressed with Honeycomb dressing.  The patient was emerged from anesthesia and extubated and brought to the recovery room for further management.  The patient tolerated the procedure well and all counts were correct at the end of the case.   Melvyn Neth, MD

## 2017-06-20 NOTE — Transfer of Care (Signed)
Immediate Anesthesia Transfer of Care Note  Patient: Andrea Lewis  Procedure(s) Performed: HERNIA REPAIR UMBILICAL ADULT (N/A Abdomen)  Patient Location: PACU  Anesthesia Type:General  Level of Consciousness: awake, alert , oriented and patient cooperative  Airway & Oxygen Therapy: Patient Spontanous Breathing  Post-op Assessment: Report given to RN and Post -op Vital signs reviewed and stable  Post vital signs: Reviewed and stable  Last Vitals:  Vitals:   06/20/17 1233 06/20/17 2147  BP: (!) 142/84 (!) 122/48  Pulse: 97 (!) 132  Resp: 20 (!) 26  Temp: 37.1 C   SpO2: 98% 97%    Last Pain:  Vitals:   06/20/17 1542  TempSrc:   PainSc: 0-No pain         Complications: No apparent anesthesia complications

## 2017-06-20 NOTE — Telephone Encounter (Signed)
Patient called the office today wanting an appointment to see Dr. Bary Castilla. The patient was last seen at our office in 2014 for evaluation of an umbilical hernia.  The patient states she cannot push the hernia back in and has not been able to eat or drink anything.   Patient was told that Dr. Bary Castilla is not in the office today. She was instructed to be seen by Fraser Din or go to Urgent Care or the ED for evaluation. If they felt patient needed to be seen by Dr. Bary Castilla today, then they would need to contact our office to speak with him directly.

## 2017-06-20 NOTE — ED Triage Notes (Signed)
Says umbilical hernia is out since Saturday.  Says she cant eat or drink or she gets upper abd cramping pain and nausea and vomiting.

## 2017-06-20 NOTE — Anesthesia Post-op Follow-up Note (Signed)
Anesthesia QCDR form completed.        

## 2017-06-20 NOTE — ED Provider Notes (Signed)
[facility] Emergency Department Provider Note  ____________________________________________  Time seen: Approximately 1:51 PM  I have reviewed the triage vital signs and the nursing notes.   HISTORY  Chief Complaint Abdominal Pain and Hernia    HPI Andrea Lewis is a 51 y.o. female w/ a remote hx of c/s and known umbilical hernia presenting with unreducible umbilical hernia, anorexia with nausea. The patient has been evaluated for hernia repair at the umbilicus in the past, "but I never followed up." Since Saturday, the patient has noted that her hernia no longer spontaneously reduces. She has tried laying down, and that did not improve her symptoms. Her hernia has become enlarged and hardened, with bluish discoloration over the umbilicus. She has had anorexia and nausea with several episodes of vomiting. She continues to have normal bowel movements and passes gas normally. She is not had any fever or chills.Last ate or drank yesterday.   Past Medical History:  Diagnosis Date  . Anemia 10/20/2011   d/t chronic blood loss  . Anxiety   . ASCUS with positive high risk HPV cervical 09/18/2008   ascus; hpv +  . Fibroid 2014   c menorrhagia  . History of mammography, diagnostic 02/24/2015; 03/12/2016   BIRAD 2; NEG  . History of Papanicolaou smear of cervix 12/25/2013   -/-  . Menorrhagia 10/24/2012  . Umbilical hernia 31/54/0086   REDUCIBLE    Patient Active Problem List   Diagnosis Date Noted  . Encounter for other contraceptive management 11/16/2016  . Umbilical hernia without obstruction and without gangrene 11/29/2012  . Leiomyoma of uterus 11/29/2012    Past Surgical History:  Procedure Laterality Date  . CERVICAL BIOPSY  W/ LOOP ELECTRODE EXCISION  04/04/2009  . [place]  . COLONOSCOPY  2010  . 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 LEEP  2010    Current Outpatient Rx  . Order #: [id number] Class: No Print  . Order #:  [id number] Class: No Print    Allergies Patient has no known allergies.  Family History  Problem Relation Age of Onset  . Adopted: Yes    Social History Social History  Substance Use Topics  . Smoking status: Never Smoker  . Smokeless tobacco: Never Used  . Alcohol use No    Review of Systems Constitutional: No fever/chills.no lightheadedness or syncope.Eyes: No visual changes. ENT: No sore throat. No congestion or rhinorrhea. Cardiovascular: Denies chest pain. Denies palpitations. Respiratory: Denies shortness of breath.  No cough. Gastrointestinal: positive unreducible umbilical hernia with associated pain. + nausea, + vomiting.  No diarrhea.  No constipation.  Genitourinary: Negative for dysuria. Musculoskeletal: Negative for back pain. Skin: Negative for rash. Neurological: Negative for headaches. No focal numbness, tingling or weakness.     ____________________________________________   PHYSICAL EXAM:  VITAL SIGNS: ED Triage Vitals  Enc Vitals Group     BP 06/20/17 1233 (!) 142/84     Pulse Rate 06/20/17 1233 97     Resp 06/20/17 1233 20     Temp 06/20/17 1233 98.7 F (37.1 C)     Temp Source 06/20/17 1233 Oral     SpO2 06/20/17 1233 98 %     Weight 06/20/17 1237 150 lb (68 kg)     Height 06/20/17 1237 5\' 3"  (1.6 m)     Head Circumference --      Peak Flow --      Pain Score 06/20/17 1236 1     Pain Loc --  Pain Edu? --      Excl. in [employer]? --     Constitutional: Alert and oriented. Well appearing and in no acute distress. Answers questions appropriately. Eyes: Conjunctivae are normal.  EOMI. No scleral icterus. Head: Atraumatic. Nose: No congestion/rhinnorhea. Mouth/Throat: Mucous membranes are moist.  Neck: No stridor.  Supple.   Cardiovascular: Normal rate, regular rhythm. No murmurs, rubs or gallops.  Respiratory: Normal respiratory effort.  No accessory muscle use or retractions. Lungs CTAB.  No wheezes, rales or ronchi. Gastrointestinal:  Soft, and nondistended. Patient has a large mass approximately the size of a grapefruit incorporating the umbilicus with bluish discoloration. It is hardened and with significant pressure, I am unable to get it to reduceompletely although I am able to get to partially reduce. No guarding or rebound.  No peritoneal signs. Musculoskeletal: No LE edema. No ttp in the calves or palpable cords.  Negative Homan's sign. Neurologic:  A&Ox3.  Speech is clear.  Face and smile are symmetric.  EOMI.  Moves all extremities well. Skin:  Skin is warm, dry and intact. No rash noted. Psychiatric: Mood and affect are normal. Speech and behavior are normal.  Normal judgement.  ____________________________________________   LABS (all labs ordered are listed, but only abnormal results are displayed)  Labs Reviewed  COMPREHENSIVE METABOLIC PANEL - Abnormal; Notable for the following:       Result Value   Glucose, Bld 128 (*)    BUN 23 (*)    Total Protein 8.3 (*)    All other components within normal limits  CBC - Abnormal; Notable for the following:    RBC 5.47 (*)    RDW 15.7 (*)    All other components within normal limits  URINALYSIS, COMPLETE (UACMP) WITH MICROSCOPIC - Abnormal; Notable for the following:    Color, Urine AMBER (*)    APPearance HAZY (*)    Specific Gravity, Urine 1.032 (*)    Hgb urine dipstick MODERATE (*)    Ketones, ur 5 (*)    Protein, ur 100 (*)    Squamous Epithelial / LPF 0-5 (*)    All other components within normal limits  LIPASE, BLOOD  PROTIME-INR  APTT  POC URINE PREG, ED  TYPE AND SCREEN   ____________________________________________  EKG  ED ECG REPORT I, Eula Listen, the attending physician, personally viewed and interpreted this ECG.   Date: 06/20/2017  EKG Time: 1418  Rate: 81  Rhythm: normal sinus rhythm  Axis: normal  Intervals:none  ST&T Change: nonspecific T-wave inversion in V1 and V3. No  STEMI  ____________________________________________  RADIOLOGY  No results found.  ____________________________________________   PROCEDURES  Procedure(s) performed: None  Procedures  Critical Care performed: No ____________________________________________   INITIAL IMPRESSION / ASSESSMENT AND PLAN / ED COURSE  Pertinent labs & imaging results that were available during my care of the patient were reviewed by me and considered in my medical decision making (see chart for details).  51 y.o. Female with a known umbilical hernia that is generally reducible presenting with a reducible umbilical hernia. On my examination, the patient's physical exam findings are most consistent with incarcerated bowel. We will place the patient in Trendelenburg and placed ice over her hernia in an attempt to reduce it in the emergency department. She will receive symptomatic treatment. I've ordered a CT scan and will speak to the general surgeons.  ----------------------------------------- 3:02 PM on 06/20/2017 -----------------------------------------  Dr. Rosana Hoes has been at bedside attempting to reduce the patient's hernia. I'm  awaiting the results of her abdominal CT.  The patient has been signed out to Dr. Cherylann Banas for final disposition.  ____________________________________________  FINAL CLINICAL IMPRESSION(S) / ED DIAGNOSES  Final diagnoses:  Umbilical hernia without obstruction and without gangrene  Nausea and vomiting, intractability of vomiting not specified, unspecified vomiting type         NEW MEDICATIONS STARTED DURING THIS VISIT:  New Prescriptions   No medications on file       Eula Listen, MD 06/20/17 1503

## 2017-06-20 NOTE — Consult Note (Signed)
SURGICAL CONSULTATION NOTE (initial) - cpt: 99254  HISTORY OF PRESENT ILLNESS (HPI):  51 y.o. female presented to [facility] ED for evaluation of abdominal pain with nausea and emesis. Patient reports her chronic umbilical hernia which always spontaneously self-reduced with laying down "popped out" on Saturday ~1 pm after drinking orange juice and eating a light lunch, after which her hernia has not since been reduced. Though she reports daily BM's without constipation, she has since developed N/V with intolerance of anything by mouth even including brushing her teeth. This morning, however, she says she was able to drink a small amount of liquids without N/V. Patient otherwise reports only mild upper abdominal pain well-controlled in the ED and denies fever/chills, CP, SOB, or any prior similar experiences, but she was, however, advised by Dr. Bary Castilla 4 years ago to have her umbilical hernia repaired with mesh along with hysterectomy for large uterine fibroids, but admits she has "procrastinate".  Surgery is consulted by ED physician Dr. Mariea Clonts in this context for evaluation and management of umbilical hernia.  PAST MEDICAL HISTORY (PMH):  Past Medical History:  Diagnosis Date  . Anemia 10/20/2011   d/t chronic blood loss  . Anxiety   . ASCUS with positive high risk HPV cervical 09/18/2008   ascus; hpv +  . Fibroid 2014   c menorrhagia  . History of mammography, diagnostic 02/24/2015; 03/12/2016   BIRAD 2; NEG  . History of Papanicolaou smear of cervix 12/25/2013   -/-  . Menorrhagia 10/24/2012  . Umbilical hernia 40/98/1191   REDUCIBLE     PAST SURGICAL HISTORY [facility]):  Past Surgical History:  Procedure Laterality Date  . CERVICAL BIOPSY  W/ LOOP ELECTRODE EXCISION  04/04/2009  . [place]  . COLONOSCOPY  2010  . 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 LEEP  2010     MEDICATIONS:  Prior to Admission medications   Medication Sig Start Date End Date Taking? Authorizing Provider   Multiple Vitamin (MULTIVITAMIN) tablet Take 1 tablet by mouth daily. 4/78/29  Yes Copland, Alicia B, PA-C  NORTREL 0.5/35, 28, 0.5-35 MG-MCG tablet TAKE 1 TABLET BY MOUTH ONCE DAILY CONTINUOUS DOSING 5/62/13  Yes Copland, Deirdre Evener, PA-C     ALLERGIES:  No Known Allergies   SOCIAL HISTORY:  Social History   Social History  . Marital status: Single    Spouse name: N/A  . Number of children: N/A  . Years of education: N/A   Occupational History  . Not on file.   Social History Main Topics  . Smoking status: Never Smoker  . Smokeless tobacco: Never Used  . Alcohol use No  . Drug use: No  . Sexual activity: Not Currently    Birth control/ protection: Pill   Other Topics Concern  . Not on file   Social History Narrative  . No narrative on file    The patient currently resides (home / rehab facility / nursing home): Home The patient normally is (ambulatory / bedbound): Ambulatory   FAMILY HISTORY:  Family History  Problem Relation Age of Onset  . Adopted: Yes     REVIEW OF SYSTEMS:  Constitutional: denies weight loss, fever, chills, or sweats  Eyes: denies any other vision changes, history of eye injury  ENT: denies sore throat, hearing problems  Respiratory: denies shortness of breath, wheezing  Cardiovascular: denies chest pain, palpitations  Gastrointestinal: abdominal pain, N/V, and bowel function as per HPI Genitourinary: denies burning with urination or urinary frequency  Musculoskeletal: denies any other joint pains or cramps  Skin: denies any other rashes or skin discolorations  Neurological: denies any other headache, dizziness, weakness  Psychiatric: denies any other depression, anxiety   All other review of systems were negative   VITAL SIGNS:  Temp:  [98.7 F (37.1 C)] 98.7 F (37.1 C) (10/22 1233) Pulse Rate:  [97] 97 (10/22 1233) Resp:  [20] 20 (10/22 1233) BP: (142)/(84) 142/84 (10/22 1233) SpO2:  [98 %] 98 % (10/22 1233) Weight:  [150 lb (68  kg)] 150 lb (68 kg) (10/22 1237)     Height: 5\' 3"  (160 cm) Weight: 150 lb (68 kg) BMI (Calculated): 26.58   INTAKE/OUTPUT:  This shift: No intake/output data recorded.  Last 2 shifts: @IOLAST2SHIFTS @   PHYSICAL EXAM:  Constitutional:  -- Normal body habitus  -- Awake, alert, and oriented x3  Eyes:  -- Pupils equally round and reactive to light  -- No scleral icterus  Ear, nose, and throat:  -- No jugular venous distension  Pulmonary:  -- No crackles  -- Equal breath sounds bilaterally -- Breathing non-labored at rest Cardiovascular:  -- S1, S2 present  -- No pericardial rubs Gastrointestinal:  -- Abdomen soft and overweight/non-obese with possibly mild abdominal distention and only minimal upper abdominal tenderness to palpation, no guarding or rebound tenderness with obvious non-reducible incarcerated peri-umbilical hernia containing what appears to be bowel considering gurgling with only partially successful attempt to reduce peri-umbilical hernia bedside -- No other abdominal masses appreciated, pulsatile or otherwise  Musculoskeletal and Integumentary:  -- Wounds or skin discoloration: None appreciated -- Extremities: B/L UE and LE FROM, hands and feet warm, no edema  Neurologic:  -- Motor function: intact and symmetric -- Sensation: intact and symmetric  Labs:  CBC Latest Ref Rng & Units 06/20/2017 02/10/2017  WBC 3.6 - 11.0 K/uL 10.3 4.0  Hemoglobin 12.0 - 16.0 g/dL 14.5 10.8(L)  Hematocrit 35.0 - 47.0 % 44.8 35.4  Platelets 150 - 440 K/uL 387 287   CMP Latest Ref Rng & Units 06/20/2017  Glucose 65 - 99 mg/dL 128(H)  BUN 6 - 20 mg/dL 23(H)  Creatinine 0.44 - 1.00 mg/dL 0.76  Sodium 135 - 145 mmol/L 140  Potassium 3.5 - 5.1 mmol/L 4.9  Chloride 101 - 111 mmol/L 102  CO2 22 - 32 mmol/L 26  Calcium 8.9 - 10.3 mg/dL 10.2  Total Protein 6.5 - 8.1 g/dL 8.3(H)  Total Bilirubin 0.3 - 1.2 mg/dL 0.9  Alkaline Phos 38 - 126 U/L 62  AST 15 - 41 U/L 19  ALT 14 - 54 U/L 16    Assessment/Plan: (ICD-10's: K42.0) 51 y.o. female with acutely incarcerated likely bowel-containing chronic umbilical hernia, complicated by likely SBO and by pertinent comorbidities including large uterine fibroids with menorrhagia, chronic anemia, and generalized anxiety disorder.   - NPO, IVF, pain control prn  - bedside attempt to reduce hernia following pain medication was unsuccessful   - follow-up CT abdomen and pelvis to better visualize hernia location, contents, and assess for SBO   - anticipate will likely require urgent repair of incarcerated bowel-containing umbilical hernia  - medical management of comorbidities  - DVT prophylaxis  All of the above findings and recommendations were discussed with the patient and her ED physician and RN, and all of patient's questions were answered to her expressed satisfaction.  Thank you for the opportunity to participate in this patient's care.   -- Marilynne Drivers Rosana Hoes, MD, [place]: [facility] - Partnering for exceptional care. Office: [phone]

## 2017-06-20 NOTE — H&P (Signed)
ADDENDUM: CT Abdomen and Pelvis with Contrast (06/20/2017) - personally reviewed with patient bedside and with ED physician and RN Stomach is distended. Duodenum is normally positioned as is the ligament of Treitz. Small bowel loops in the abdomen are diffusely fluid filled and distended. Dilated small bowel measures up to 3.1 cm tracking into the umbilical hernia where a short segment of small bowel protrudes through the midline fascial defect and small bowel exiting the hernia sac is decompressed. There is edema in the hernia sac around the herniated short segment small bowel. Hernia protrudes through a fascial defect measuring 13 x 20 mm in transverse orthogonal diameter and generates a hernia sac measuring 9.6 x 6.6 x 9.0 cm. Terminal ileum is decompressed. The appendix is normal. No gross colonic mass. No colonic wall thickening. No substantial diverticular change.  Uterus is dramatically enlarged by fibroids, measuring 19.7 cm in  cranial length by 22.3 cm and coronal diameter by 12.2 cm in AP diameter.  There is no adnexal mass. Small volume free fluid noted in the cul-de-sac.  There is some mesenteric edema and interloop mesenteric fluid  associated with the dilated proximal small bowel loops.  Assessment/Plan: (ICD-10's: K54.0) 51 y.o. female with complete SBO secondary to acutely incarcerated likely bowel-containing chronic umbilical hernia, complicated by pertinent comorbidities including large bulky uterine fibroids with menorrhagia, chronic anemia, and generalized anxiety disorder.   - NPO, IVF, pain control prn  - bedside attempt to reduce hernia following pain medication was unsuccessful   - all risks, benefits, and alternatives to open likely primary repair of incarcerated umbilical hernia with possible biologic/non-synthetic mesh were discussed with the patient, all of her questions were answered to her expressed satisfaction, patient expresses she wishes to proceed, and  informed consent was accordingly obtained.  - will plan for repair of incarcerated bowel-containing umbilical hernia pending OR availability  - medical management of comorbidities  - DVT prophylaxis  All of the above findings and recommendations were discussed with the patient and her ED physician and RN, and all of patient's questions were answered to her expressed satisfaction.  -- Marilynne Drivers Rosana Hoes, MD, [place]: [facility] - Partnering for exceptional care. Office: [phone]      SURGICAL ADMISSION HISTORY & PHYSICAL  HISTORY OF PRESENT ILLNESS (HPI):  51 y.o. female presented to [facility] ED for evaluation of abdominal pain with nausea and emesis. Patient reports her chronic umbilical hernia which always spontaneously self-reduced with laying down "popped out" on Saturday ~1 pm after drinking orange juice and eating a light lunch, after which her hernia has not since been reduced. Though she reports daily BM's without constipation, she has since developed N/V with intolerance of anything by mouth even including brushing her teeth. This morning, however, she says she was able to drink a small amount of liquids without N/V. Patient otherwise reports only mild upper abdominal pain well-controlled in the ED and denies fever/chills, CP, SOB, or any prior similar experiences, but she was, however, advised by Dr. Bary Castilla 4 years ago to have her umbilical hernia repaired with mesh along with hysterectomy for large uterine fibroids, but admits she has "procrastinate".  Surgery is consulted by ED physician Dr. Mariea Clonts in this context for evaluation and management of umbilical hernia.  PAST MEDICAL HISTORY (PMH):  Past Medical History:  Diagnosis Date  . Anemia 10/20/2011   d/t chronic blood loss  . Anxiety   . ASCUS with positive high risk HPV cervical 09/18/2008  ascus; hpv +  . Fibroid 2014   c menorrhagia  . History of mammography, diagnostic  02/24/2015; 03/12/2016   BIRAD 2; NEG  . History of Papanicolaou smear of cervix 12/25/2013   -/-  . Menorrhagia 10/24/2012  . Umbilical hernia 24/26/8341   REDUCIBLE     PAST SURGICAL HISTORY [facility]):  Past Surgical History:  Procedure Laterality Date  . CERVICAL BIOPSY  W/ LOOP ELECTRODE EXCISION  04/04/2009  . [place]  . COLONOSCOPY  2010  . 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 LEEP  2010     MEDICATIONS:  Prior to Admission medications   Medication Sig Start Date End Date Taking? Authorizing Provider  Multiple Vitamin (MULTIVITAMIN) tablet Take 1 tablet by mouth daily. 9/62/22  Yes Copland, Alicia B, PA-C  NORTREL 0.5/35, 28, 0.5-35 MG-MCG tablet TAKE 1 TABLET BY MOUTH ONCE DAILY CONTINUOUS DOSING 9/79/89  Yes Copland, Deirdre Evener, PA-C     ALLERGIES:  No Known Allergies   SOCIAL HISTORY:  Social History   Social History  . Marital status: Single    Spouse name: N/A  . Number of children: N/A  . Years of education: N/A   Occupational History  . Not on file.   Social History Main Topics  . Smoking status: Never Smoker  . Smokeless tobacco: Never Used  . Alcohol use No  . Drug use: No  . Sexual activity: Not Currently    Birth control/ protection: Pill   Other Topics Concern  . Not on file   Social History Narrative  . No narrative on file    The patient currently resides (home / rehab facility / nursing home): Home The patient normally is (ambulatory / bedbound): Ambulatory   FAMILY HISTORY:  Family History  Problem Relation Age of Onset  . Adopted: Yes     REVIEW OF SYSTEMS:  Constitutional: denies weight loss, fever, chills, or sweats  Eyes: denies any other vision changes, history of eye injury  ENT: denies sore throat, hearing problems  Respiratory: denies shortness of breath, wheezing  Cardiovascular: denies chest pain, palpitations  Gastrointestinal: abdominal pain, N/V, and bowel function as per HPI Genitourinary: denies burning  with urination or urinary frequency Musculoskeletal: denies any other joint pains or cramps  Skin: denies any other rashes or skin discolorations  Neurological: denies any other headache, dizziness, weakness  Psychiatric: denies any other depression, anxiety   All other review of systems were negative   VITAL SIGNS:  Temp:  [98.7 F (37.1 C)] 98.7 F (37.1 C) (10/22 1233) Pulse Rate:  [97] 97 (10/22 1233) Resp:  [20] 20 (10/22 1233) BP: (142)/(84) 142/84 (10/22 1233) SpO2:  [98 %] 98 % (10/22 1233) Weight:  [150 lb (68 kg)] 150 lb (68 kg) (10/22 1237)     Height: 5\' 3"  (160 cm) Weight: 150 lb (68 kg) BMI (Calculated): 26.58   INTAKE/OUTPUT:  This shift: No intake/output data recorded.  Last 2 shifts: @IOLAST2SHIFTS @   PHYSICAL EXAM:  Constitutional:  -- Normal body habitus  -- Awake, alert, and oriented x3  Eyes:  -- Pupils equally round and reactive to light  -- No scleral icterus  Ear, nose, and throat:  -- No jugular venous distension  Pulmonary:  -- No crackles  -- Equal breath sounds bilaterally -- Breathing non-labored at rest Cardiovascular:  -- S1, S2 present  -- No pericardial rubs Gastrointestinal:  -- Abdomen soft and overweight/non-obese with possibly mild abdominal distention and only minimal  upper abdominal tenderness to palpation, no guarding or rebound tenderness with obvious non-reducible incarcerated peri-umbilical hernia containing what appears to be bowel considering gurgling with only partially successful attempt to reduce peri-umbilical hernia bedside -- No other abdominal masses appreciated, pulsatile or otherwise  Musculoskeletal and Integumentary:  -- Wounds or skin discoloration: None appreciated -- Extremities: B/L UE and LE FROM, hands and feet warm, no edema  Neurologic:  -- Motor function: intact and symmetric -- Sensation: intact and symmetric  Labs:  CBC Latest Ref Rng & Units 06/20/2017 02/10/2017  WBC 3.6 - 11.0 K/uL 10.3 4.0   Hemoglobin 12.0 - 16.0 g/dL 14.5 10.8(L)  Hematocrit 35.0 - 47.0 % 44.8 35.4  Platelets 150 - 440 K/uL 387 287   CMP Latest Ref Rng & Units 06/20/2017  Glucose 65 - 99 mg/dL 128(H)  BUN 6 - 20 mg/dL 23(H)  Creatinine 0.44 - 1.00 mg/dL 0.76  Sodium 135 - 145 mmol/L 140  Potassium 3.5 - 5.1 mmol/L 4.9  Chloride 101 - 111 mmol/L 102  CO2 22 - 32 mmol/L 26  Calcium 8.9 - 10.3 mg/dL 10.2  Total Protein 6.5 - 8.1 g/dL 8.3(H)  Total Bilirubin 0.3 - 1.2 mg/dL 0.9  Alkaline Phos 38 - 126 U/L 62  AST 15 - 41 U/L 19  ALT 14 - 54 U/L 16   Assessment/Plan: (ICD-10's: K42.0) 51 y.o. female with acutely incarcerated likely bowel-containing chronic umbilical hernia, complicated by likely SBO and by pertinent comorbidities including large uterine fibroids with menorrhagia, chronic anemia, and generalized anxiety disorder.   - NPO, IVF, pain control prn  - bedside attempt to reduce hernia following pain medication was unsuccessful   - follow-up CT abdomen and pelvis to better visualize hernia location, contents, and assess for SBO   - anticipate will likely require urgent repair of incarcerated bowel-containing umbilical hernia  - medical management of comorbidities  - DVT prophylaxis  All of the above findings and recommendations were discussed with the patient and her ED physician and RN, and all of patient's questions were answered to her expressed satisfaction.  -- Marilynne Drivers Rosana Hoes, MD, [place]: [facility] - Partnering for exceptional care. Office: [phone]

## 2017-06-20 NOTE — ED Notes (Signed)
Consulting Provider at bedside. 

## 2017-06-20 NOTE — Anesthesia Procedure Notes (Signed)
Procedure Name: Intubation Date/Time: 06/20/2017 7:49 PM Performed by: Lendon Colonel Pre-anesthesia Checklist: Patient identified, Emergency Drugs available, Suction available and Patient being monitored Patient Re-evaluated:Patient Re-evaluated prior to induction Oxygen Delivery Method: Circle system utilized Preoxygenation: Pre-oxygenation with 100% oxygen Induction Type: IV induction, Rapid sequence and Cricoid Pressure applied Laryngoscope Size: Miller and 2 Grade View: Grade III Tube size: 7.0 mm Number of attempts: 1 Airway Equipment and Method: Stylet Placement Confirmation: ETT inserted through vocal cords under direct vision,  positive ETCO2 and breath sounds checked- equal and bilateral Secured at: 21 cm Difficulty Due To: Difficult Airway- due to anterior larynx and Difficult Airway- due to reduced neck mobility

## 2017-06-20 NOTE — Telephone Encounter (Signed)
She needs to go to ED. Probably can't get her into office appt sooner and can see gen surg there. Will also need hyst due to super large leio, but they can coordinate with our MDs.

## 2017-06-20 NOTE — Telephone Encounter (Signed)
Network difficulties, unable to complete call as dialed."  On the fourth try I didn't dial [phone] and was able to get the pt.  She is aware of ABC's rec.

## 2017-06-21 ENCOUNTER — Encounter: Payer: Self-pay | Admitting: Surgery

## 2017-06-21 LAB — BASIC METABOLIC PANEL
Anion gap: 5 (ref 5–15)
BUN: 15 mg/dL (ref 6–20)
CALCIUM: 8.1 mg/dL — AB (ref 8.9–10.3)
CO2: 23 mmol/L (ref 22–32)
Chloride: 107 mmol/L (ref 101–111)
Creatinine, Ser: 0.53 mg/dL (ref 0.44–1.00)
GFR calc Af Amer: >60 mL/min (ref >60)
GLUCOSE: 145 mg/dL — AB (ref 65–99)
Potassium: 4.2 mmol/L (ref 3.5–5.1)
Sodium: 135 mmol/L (ref 135–145)

## 2017-06-21 LAB — CBC
HCT: 35.9 % (ref 35.0–47.0)
HEMOGLOBIN: 11.6 g/dL — AB (ref 12.0–16.0)
MCH: 26.6 pg (ref 26.0–34.0)
MCHC: 32.1 g/dL (ref 32.0–36.0)
MCV: 82.7 fL (ref 80.0–100.0)
PLATELETS: 284 10*3/uL (ref 150–440)
RBC: 4.34 MIL/uL (ref 3.80–5.20)
RDW: 15.6 % — ABNORMAL HIGH (ref 11.5–14.5)
WBC: 7.2 10*3/uL (ref 3.6–11.0)

## 2017-06-21 LAB — MAGNESIUM: Magnesium: 1.8 mg/dL (ref 1.7–2.4)

## 2017-06-21 MED ORDER — OXYCODONE HCL 5 MG PO TABS: 5.0000 mg | ORAL_TABLET | ORAL | 0 refills | Status: DC | PRN

## 2017-06-21 NOTE — Progress Notes (Signed)
Received pt from [facility] @ 2240. A+Ox4. Oriented to room and unit. Dressing on midline abdomen clean, dry, intact. Pt has voided post procedure. Denies pain at this time. Fall and safety precautions maintained.

## 2017-06-21 NOTE — Discharge Summary (Signed)
Physician Discharge Summary  Patient ID: Andrea Lewis MRN: [id number] DOB/AGE: 02/12/1966 51 y.o.  Admit date: 06/20/2017 Discharge date: 06/21/2017  Admission Diagnoses:  Discharge Diagnoses:  Active Problems:   Incarcerated umbilical hernia   Discharged Condition: good  Hospital Course: 51 y.o. female presented to [facility] ED for oral intolerance with abdominal pain, nausea and vomiting. Exam and workup were found to be significant for incarcerated umbilical hernia confirmed on CT imaging which also demonstrated SBO and very large bulky uterine fibroids. Informed consent was obtained and documented, and patient underwent uneventful open primary repair of incarcerated umbilical hernia ([place], 06/20/2017).  Post-operatively, patient's pain/nausea/emesis completely resolved with resumption of bowel function, and advancement of patient's diet and ambulation were well-tolerated. The remainder of patient's hospital course was essentially unremarkable, and discharge planning was initiated accordingly with patient safely able to be discharged home with appropriate discharge instructions, pain control, and outpatient surgical and gynecology follow-up after all of her and family's questions were answered to their expressed satisfaction.  Consults: None  Significant Diagnostic Studies: radiology: CT scan: small bowel obstruction secondary to incarcerated umbilical hernia with very large bulky uterine fibroids  Treatments: surgery: open primary repair of incarcerated umbilical hernia  Discharge Exam: Blood pressure 127/76, pulse 93, temperature 97.9 F (36.6 C), temperature source Oral, resp. rate 19, height 5\' 3"  (1.6 m), weight 150 lb (68 kg), SpO2 96 %. General appearance: alert, cooperative, appears stated age and no distress GI: abdomen soft, non-tender, and non-distended with incision well-approximated without associated erythema or drainage  Disposition: Final discharge disposition not  confirmed   Allergies as of 06/21/2017   No Known Allergies     Medication List    TAKE these medications   multivitamin tablet Take 1 tablet by mouth daily.   NORTREL 0.5/35 (28) 0.5-35 MG-MCG tablet Generic drug:  norethindrone-ethinyl estradiol TAKE 1 TABLET BY MOUTH ONCE DAILY CONTINUOUS DOSING   oxyCODONE 5 MG immediate release tablet Commonly known as:  Oxy IR/ROXICODONE Take 1-2 tablets (5-10 mg total) by mouth every 4 (four) hours as needed for moderate pain.      Follow-up [place]. Schedule an appointment as soon as possible for a visit in 2 week(s).   Specialty:  General Surgery Contact information: [place] [phone]       [place], Deirdre Evener, [place]. Call.   Specialty:  Obstetrics and Gynecology Why:  Call to discuss referral for hysterectomy as discussed. Contact information: [place] [place] [address] [phone]           Signed: Vickie Epley 06/21/2017, 8:39 AM

## 2017-06-21 NOTE — Progress Notes (Signed)
[facility] Hospital Day(s): 0.   Post op day(s): 1 Day Post-Op.   Interval History: Patient seen and examined, no acute events or new complaints since repair of incarcerated umbilical hernia overnight. Patient reports ambulating without difficulty, tolerating clear liquids, and requests solid food, denies any abdominal pain, N/V, fever/chills, CP, or SOB.  Review of Systems:  Constitutional: denies fever, chills  HEENT: denies cough or congestion  Respiratory: denies any shortness of breath  Cardiovascular: denies chest pain or palpitations  Gastrointestinal: abdominal pain, N/V, and bowel function as per interval history Genitourinary: denies burning with urination or urinary frequency Musculoskeletal: denies pain, decreased motor or sensation Integumentary: denies any other rashes or skin discolorations except post-surgical abdominal wound Neurological: denies HA or vision/hearing changes   Vital signs in last 24 hours: [min-max] current  Temp:  [97.9 F (36.6 C)-98.7 F (37.1 C)] 97.9 F (36.6 C) (10/23 0517) Pulse Rate:  [93-132] 93 (10/23 0517) Resp:  [19-26] 19 (10/23 0517) BP: (122-142)/(48-84) 127/76 (10/23 0517) SpO2:  [95 %-99 %] 96 % (10/23 0517) Weight:  [150 lb (68 kg)] 150 lb (68 kg) (10/22 1237)     Height: 5\' 3"  (160 cm) Weight: 150 lb (68 kg) BMI (Calculated): 26.58   Intake/Output this shift:  No intake/output data recorded.   Intake/Output last 2 shifts:  @IOLAST2SHIFTS @   Physical Exam:  Constitutional: alert, cooperative and no distress  HENT: normocephalic without obvious abnormality  Eyes: PERRL, EOM's grossly intact and symmetric  Neuro: CN II - XII grossly intact and symmetric without deficit  Respiratory: breathing non-labored at rest  Cardiovascular: regular rate and sinus rhythm  Gastrointestinal: soft, non-tender, and non-distended with transparent honeycomb dressing c/d/i and incision well-approximated without erythema or  drainage Musculoskeletal: UE and LE FROM, no edema or wounds, motor and sensation grossly intact, NT   Labs:  CBC Latest Ref Rng & Units 06/21/2017 06/20/2017 02/10/2017  WBC 3.6 - 11.0 K/uL 7.2 10.3 4.0  Hemoglobin 12.0 - 16.0 g/dL 11.6(L) 14.5 10.8(L)  Hematocrit 35.0 - 47.0 % 35.9 44.8 35.4  Platelets 150 - 440 K/uL 284 387 287   CMP Latest Ref Rng & Units 06/21/2017 06/20/2017  Glucose 65 - 99 mg/dL 145(H) 128(H)  BUN 6 - 20 mg/dL 15 23(H)  Creatinine 0.44 - 1.00 mg/dL 0.53 0.76  Sodium 135 - 145 mmol/L 135 140  Potassium 3.5 - 5.1 mmol/L 4.2 4.9  Chloride 101 - 111 mmol/L 107 102  CO2 22 - 32 mmol/L 23 26  Calcium 8.9 - 10.3 mg/dL 8.1(L) 10.2  Total Protein 6.5 - 8.1 g/dL - 8.3(H)  Total Bilirubin 0.3 - 1.2 mg/dL - 0.9  Alkaline Phos 38 - 126 U/L - 62  AST 15 - 41 U/L - 19  ALT 14 - 54 U/L - 16   Imaging studies: No new pertinent imaging studies   Assessment/Plan: (ICD-10's: K42.0) 51 y.o. female doing well 1 Day Post-Op s/p open primary repair of incarcerated umbilical hernia associated with SBO and complicated by pertinent comorbidities including very large bulky uterine fibroids with menorrhagia, chronic anemia, and generalized anxiety disorder.   - advance diet as tolerated  - outpatient gynecology follow-up  - pain control prn, minimize narcotics   - DVT prophylaxis, ambulation encouraged  - medical management of comorbidities   - anticipate discharge planning  All of the above findings and recommendations were discussed with the patient, and all of patient's questions were answered to her expressed satisfaction.  -- Marilynne Drivers Rosana Hoes,  MD, [place]: [facility] - Partnering for exceptional care. Office: [phone]

## 2017-06-21 NOTE — Progress Notes (Signed)
Pt alert and oriented x4, no complaints of pain or discomfort.  Bed in low position, call bell within reach.  Bed alarms on and functioning.  Assessment done and charted.  Will continue to monitor and do hourly rounding throughout the shift 

## 2017-06-21 NOTE — Progress Notes (Signed)
Pt surgical site assessed and a small amt of dk blood noted.

## 2017-06-21 NOTE — Discharge Instructions (Signed)
In addition to included general post-operative instructions for Open Repair of Incarcerated Umbilical Hernia (without mesh),  Diet: Resume home heart healthy diet.   Activity: No heavy lifting >15 pounds (children, pets, laundry, garbage) or strenuous activity until follow-up, but light activity and walking are encouraged. Do not drive or drink alcohol if taking narcotic pain medications.  Wound care: Remove dressing tomorrow. Once dressing removed, 2 days after surgery (Wednesday, 10/24), may shower/get incision wet with soapy water and pat dry (do not rub incisions), but no baths or submerging incision underwater until follow-up.   Medications: Resume all home medications. For mild to moderate pain: acetaminophen (Tylenol) or ibuprofen (if no kidney disease). Combining Tylenol with alcohol can substantially increase your risk of causing liver disease. Narcotic pain medications, if prescribed, can be used for severe pain, though may cause nausea, constipation, and drowsiness. Do not combine Tylenol and Percocet within a 6 hour period as Percocet contains Tylenol. If you do not need the narcotic pain medication, you do not need to fill the prescription.  Call office [phone]) at any time if any questions, worsening pain, fevers/chills, bleeding, drainage from incision site, or other concerns.

## 2017-06-21 NOTE — Progress Notes (Signed)
Discharge:  Pt d/c from room via wheelchair, Family member with the pt.  Discharge instructions given to the patient and family members.  No questions from pt,  .  Pt dressed in street clothes and left with discharge papers and prescriptions  in hand.  IV d/ced,  no complaints of pain or discomfort.  

## 2017-06-22 LAB — SURGICAL PATHOLOGY

## 2017-06-22 NOTE — Anesthesia Postprocedure Evaluation (Signed)
Anesthesia Post Note  Patient: Chan Rosasco  Procedure(s) Performed: HERNIA REPAIR UMBILICAL ADULT (N/A Abdomen)  Patient location during evaluation: PACU Anesthesia Type: General Level of consciousness: awake and alert Pain management: pain level controlled Vital Signs Assessment: post-procedure vital signs reviewed and stable Respiratory status: spontaneous breathing, nonlabored ventilation, respiratory function stable and patient connected to nasal cannula oxygen Cardiovascular status: blood pressure returned to baseline and stable Postop Assessment: no apparent nausea or vomiting Anesthetic complications: no     Last Vitals:  Vitals:   06/21/17 0008 06/21/17 0517  BP: 128/68 127/76  Pulse: 100 93  Resp: 19 19  Temp: 36.6 C 36.6 C  SpO2: 96% 96%    Last Pain:  Vitals:   06/21/17 1342  TempSrc:   PainSc: 1                  Martha Clan

## 2017-07-04 ENCOUNTER — Ambulatory Visit (INDEPENDENT_AMBULATORY_CARE_PROVIDER_SITE_OTHER): Payer: BC Managed Care – PPO | Admitting: Surgery

## 2017-07-04 ENCOUNTER — Encounter: Payer: Self-pay | Admitting: Surgery

## 2017-07-04 VITALS — BP 138/91 | HR 80 | Temp 97.6°F | Ht 63.0 in | Wt 157.0 lb

## 2017-07-04 DIAGNOSIS — Z09 Encounter for follow-up examination after completed treatment for conditions other than malignant neoplasm: Secondary | ICD-10-CM

## 2017-07-04 NOTE — Patient Instructions (Signed)

## 2017-07-04 NOTE — Progress Notes (Signed)
S/p UH repair for incarceration 10/22 By Dr. Hampton Abbot Doing well Some soreness + PO, no fevers  PE NAD Abd: soft, incisions c/d/i. Staples removed  A/p Doing well No complication No hevy lifting RTC prn

## 2017-11-21 ENCOUNTER — Telehealth: Payer: Self-pay

## 2017-11-21 DIAGNOSIS — Z3041 Encounter for surveillance of contraceptive pills: Secondary | ICD-10-CM

## 2017-11-21 NOTE — Telephone Encounter (Signed)
Pt states she should have refills on Nortrel 28 until 01/2018.  [facility] [place] pharm says she is out of refills.  Please send in refills to get her to annual exam.  [phone]

## 2017-11-22 MED ORDER — NORETHINDRONE-ETH ESTRADIOL 0.5-35 MG-MCG PO TABS: ORAL_TABLET | ORAL | 2 refills | Status: DC

## 2017-11-23 NOTE — Telephone Encounter (Signed)
Patient is calling due to missed call

## 2017-11-23 NOTE — Telephone Encounter (Signed)
Called pt and she is aware of medication refill sent to pharmacy. Left VM for medication to be refilled at [facility]

## 2017-11-23 NOTE — Telephone Encounter (Signed)
Pt came in today about message that was sent. Please have this taken care of today as she needs this done by Friday. Thank you!!

## 2018-01-25 ENCOUNTER — Other Ambulatory Visit: Payer: Self-pay | Admitting: Obstetrics and Gynecology

## 2018-01-25 DIAGNOSIS — Z3041 Encounter for surveillance of contraceptive pills: Secondary | ICD-10-CM

## 2018-02-02 ENCOUNTER — Ambulatory Visit (INDEPENDENT_AMBULATORY_CARE_PROVIDER_SITE_OTHER): Payer: BC Managed Care – PPO | Admitting: Obstetrics and Gynecology

## 2018-02-02 ENCOUNTER — Encounter: Payer: Self-pay | Admitting: Obstetrics and Gynecology

## 2018-02-02 VITALS — BP 134/82 | HR 72 | Ht 63.0 in | Wt 162.0 lb

## 2018-02-02 DIAGNOSIS — Z3041 Encounter for surveillance of contraceptive pills: Secondary | ICD-10-CM

## 2018-02-02 DIAGNOSIS — Z131 Encounter for screening for diabetes mellitus: Secondary | ICD-10-CM

## 2018-02-02 DIAGNOSIS — Z1239 Encounter for other screening for malignant neoplasm of breast: Secondary | ICD-10-CM

## 2018-02-02 DIAGNOSIS — Z01419 Encounter for gynecological examination (general) (routine) without abnormal findings: Secondary | ICD-10-CM | POA: Diagnosis not present

## 2018-02-02 DIAGNOSIS — Z1211 Encounter for screening for malignant neoplasm of colon: Secondary | ICD-10-CM | POA: Diagnosis not present

## 2018-02-02 DIAGNOSIS — D251 Intramural leiomyoma of uterus: Secondary | ICD-10-CM

## 2018-02-02 DIAGNOSIS — Z1321 Encounter for screening for nutritional disorder: Secondary | ICD-10-CM

## 2018-02-02 DIAGNOSIS — Z13220 Encounter for screening for lipoid disorders: Secondary | ICD-10-CM | POA: Diagnosis not present

## 2018-02-02 DIAGNOSIS — N920 Excessive and frequent menstruation with regular cycle: Secondary | ICD-10-CM | POA: Diagnosis not present

## 2018-02-02 DIAGNOSIS — Z0000 Encounter for general adult medical examination without abnormal findings: Secondary | ICD-10-CM

## 2018-02-02 DIAGNOSIS — Z1231 Encounter for screening mammogram for malignant neoplasm of breast: Secondary | ICD-10-CM | POA: Diagnosis not present

## 2018-02-02 LAB — HEMOCCULT GUIAC POC 1CARD (OFFICE): Fecal Occult Blood, POC: NEGATIVE

## 2018-02-02 MED ORDER — NORETHINDRONE-ETH ESTRADIOL 0.5-35 MG-MCG PO TABS: ORAL_TABLET | ORAL | 4 refills | Status: DC

## 2018-02-02 NOTE — Progress Notes (Signed)
Chief Complaint  Patient presents with  . Gynecologic Exam     HPI:      [name] is a 52 y.o. [id number] who LMP was No LMP recorded. (Menstrual status: Irregular Periods)., presents today for her annual examination.  Her menses are absent due to continuous dosing of OCPs for cycle control.  Dysmenorrhea none. She does have occas intermenstrual bleeding, light and for 1 day. 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 She saw Dr. Kenton Kingfisher in 2014 who recommended lupron to shrink the fibroids before hyst, but that was too expensive and pt not interested. Her bleeding is controlled with OCPs. There is concern about the size of her uterus. Pt also had an umbilical hernia that complicated the situation and this was surgically repaired emergently 10/18. Pt states she feels much better.   Sex activity: not sexually active.  Last Pap: 01/27/17 Results were: no abnormalities /neg HPV DNA . 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  Last mammogram: 03/16/17  Results were: normal--routine follow-up in 12 months FH unknown due to adoption. The patient does not do self-breast exams.  Tobacco use: The patient denies current or previous tobacco use. Alcohol use: none Exercise: moderately active  She does get adequate calcium and Vitamin D in her diet.  She had borderline lipids, anemia, and Vit D deficiency on 2017 labs, but pt not interested in lipid meds, She didn't do labs last yr.  She has lost wt with wt watchers.    Past Medical History:  Diagnosis Date  . Anemia 10/20/2011   d/t chronic blood loss  . Anxiety   . ASCUS with positive high risk HPV cervical 09/18/2008   [place]; hpv +  . Fibroid 2014   c menorrhagia  . History of mammography, diagnostic 02/24/2015; 03/12/2016   BIRAD 2; NEG  . History of Papanicolaou smear of cervix 12/25/2013   -/-  . Menorrhagia 10/24/2012  .  Umbilical hernia 61/44/3154   REDUCIBLE    Past Surgical History:  Procedure Laterality Date  . CERVICAL BIOPSY  W/ LOOP ELECTRODE EXCISION  04/04/2009  . [place]  . COLONOSCOPY  2010  . 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 LEEP  2010  . UMBILICAL HERNIA REPAIR N/A 06/20/2017   Procedure: HERNIA REPAIR UMBILICAL ADULT;  Surgeon: Olean Ree, MD;  Location: [facility] ORS;  Service: General;  Laterality: N/A;    Family History  Adopted: Yes    Social History   Socioeconomic History  . Marital status: Single    Spouse name: Not on file  . Number of children: Not on file  . Years of education: Not on file  . Highest education level: Not on file  Occupational History  . Not on file  Social Needs  . Financial resource strain: Not on file  . Food insecurity:    Worry: Not on file    Inability: Not on file  . Transportation needs:    Medical: Not on file    Non-medical: Not on file  Tobacco Use  . Smoking status: Never Smoker  . Smokeless tobacco: Never Used  Substance and Sexual Activity  . Alcohol use: No  . Drug use: No  . Sexual activity: Not Currently    Birth control/protection: Pill  Lifestyle  . Physical activity:    Days per  week: Not on file    Minutes per session: Not on file  . Stress: Not on file  Relationships  . Social connections:    Talks on phone: Not on file    Gets together: Not on file    Attends religious service: Not on file    Active member of club or organization: Not on file    Attends meetings of clubs or organizations: Not on file    Relationship status: Not on file  . Intimate partner violence:    Fear of current or ex partner: Not on file    Emotionally abused: Not on file    Physically abused: Not on file    Forced sexual activity: Not on file  Other Topics Concern  . Not on file  Social History Narrative  . Not on file    Current Outpatient Medications on File Prior to Visit  Medication Sig Dispense Refill  .  Multiple Vitamin (MULTIVITAMIN) tablet Take 1 tablet by mouth daily. 28 tablet 4  . Probiotic Product (PROBIOTIC-10) CAPS Take by mouth.     No current facility-administered medications on file prior to visit.     ROS:  Review of Systems  Constitutional: Negative for fatigue, fever and unexpected weight change.  Respiratory: Negative for cough, shortness of breath and wheezing.   Cardiovascular: Negative for chest pain, palpitations and leg swelling.  Gastrointestinal: Negative for blood in stool, constipation, diarrhea, nausea and vomiting.  Endocrine: Negative for cold intolerance, heat intolerance and polyuria.  Genitourinary: Negative for dyspareunia, dysuria, flank pain, frequency, genital sores, hematuria, menstrual problem, pelvic pain, urgency, vaginal bleeding, vaginal discharge and vaginal pain.  Musculoskeletal: Negative for back pain, joint swelling and myalgias.  Skin: Negative for rash.  Neurological: Negative for dizziness, syncope, light-headedness, numbness and headaches.  Hematological: Negative for adenopathy.  Psychiatric/Behavioral: Negative for agitation, confusion, sleep disturbance and suicidal ideas. The patient is not nervous/anxious.      Objective: BP 134/82   Pulse 72   Ht 5\' 3"  (1.6 m)   Wt 162 lb (73.5 kg)   BMI 28.70 kg/m    Physical Exam  Constitutional: She is oriented to person, place, and time. She appears well-developed and well-nourished.  Genitourinary: Vagina normal. There is no rash or tenderness on the right labia. There is no rash or tenderness on the left labia. No erythema or tenderness in the vagina. No vaginal discharge found. Right adnexum does not display mass and does not display tenderness. Left adnexum does not display mass and does not display tenderness. Cervix does not exhibit motion tenderness or polyp.   Uterus is enlarged and exhibiting a mass. Uterus is not tender.  Neck: Normal range of motion. No thyromegaly present.    Cardiovascular: Normal rate, regular rhythm and normal heart sounds.  No murmur heard. Pulmonary/Chest: Effort normal and breath sounds normal. Right breast exhibits no mass, no nipple discharge, no skin change and no tenderness. Left breast exhibits no mass, no nipple discharge, no skin change and no tenderness.  Abdominal: Soft. She exhibits mass. There is no tenderness. There is no guarding.    ~23 WK SIZE UTERUS DUE TO LEIO  Musculoskeletal: Normal range of motion.  Neurological: She is alert and oriented to person, place, and time. No cranial nerve deficit.  Psychiatric: She has a normal mood and affect. Her behavior is normal.  Vitals reviewed.   Results: Results for orders placed or performed in visit on 02/02/18 (from the past 24 hour(s))  POCT  Occult Blood Stool     Status: Normal   Collection Time: 02/02/18  5:29 PM  Result Value Ref Range   Fecal Occult Blood, POC Negative Negative   Card #1 Date     Card #2 Fecal Occult Blod, POC     Card #2 Date     Card #3 Fecal Occult Blood, POC     Card #3 Date      Assessment/Plan: Encounter for annual routine gynecological examination  Screening for breast cancer - Pt to sched mammo. - Plan: MM DIGITAL SCREENING BILATERAL  Encounter for surveillance of contraceptive pills - OCP RF. Will re-eval next yr.  - Plan: norethindrone-ethinyl estradiol (NORTREL 0.5/35, 28,) 0.5-35 MG-MCG tablet  Menorrhagia with regular cycle - Due to leio, controlled with OCPs.  - Plan: norethindrone-ethinyl estradiol (NORTREL 0.5/35, 28,) 0.5-35 MG-MCG tablet  Blood tests for routine general physical examination - Plan: Comprehensive metabolic panel, Lipid panel, Hemoglobin A1c, VITAMIN D 25 Hydroxy (Vit-D Deficiency, Fractures)  Screening cholesterol level - Plan: Lipid panel  Encounter for vitamin deficiency screening - Plan: VITAMIN D 25 Hydroxy (Vit-D Deficiency, Fractures)  Screening for diabetes mellitus  Screening for colon cancer - Neg  FOBT. Scr colonoscopy vs Cologuard discussed. Pt to consider and f/u prn ref  - Plan: POCT Occult Blood Stool  Intramural leiomyoma of uterus - ~23 wk size uterus, no real change from last yr. Sx controlled with OCPs. Pt doesn't want surg. F/u prn.   Meds ordered this encounter  Medications  . norethindrone-ethinyl estradiol (NORTREL 0.5/35, 28,) 0.5-35 MG-MCG tablet    Sig: Take 1 po daily continuous dosing    Dispense:  84 tablet    Refill:  4    Order Specific Question:   Supervising Provider    Answer:   Gae Dry [[id number]]             GYN counsel breast self exam, mammography screening, menopause, adequate intake of calcium and vitamin D, diet and exercise     F/U  Return in about 1 year (around 02/03/2019).  Winfrey Chillemi B. Benjimin Hadden, PA-C 02/02/2018 5:30 PM

## 2018-02-02 NOTE — Patient Instructions (Signed)
I value your feedback and entrusting us with your care. If you get a [facility] patient survey, I would appreciate you taking the time to let us know about your experience today. Thank you! 

## 2018-02-07 ENCOUNTER — Other Ambulatory Visit: Payer: BC Managed Care – PPO

## 2018-02-07 DIAGNOSIS — Z0000 Encounter for general adult medical examination without abnormal findings: Secondary | ICD-10-CM

## 2018-02-07 DIAGNOSIS — Z1321 Encounter for screening for nutritional disorder: Secondary | ICD-10-CM

## 2018-02-07 DIAGNOSIS — Z13220 Encounter for screening for lipoid disorders: Secondary | ICD-10-CM

## 2018-02-08 LAB — LIPID PANEL
CHOL/HDL RATIO: 3.9 ratio (ref 0.0–4.4)
Cholesterol, Total: 202 mg/dL — ABNORMAL HIGH (ref 100–199)
HDL: 52 mg/dL (ref >39)
LDL CALC: 118 mg/dL — AB (ref 0–99)
TRIGLYCERIDES: 160 mg/dL — AB (ref 0–149)
VLDL Cholesterol Cal: 32 mg/dL (ref 5–40)

## 2018-02-08 LAB — COMPREHENSIVE METABOLIC PANEL
A/G RATIO: 1.6 (ref 1.2–2.2)
ALT: 9 IU/L (ref 0–32)
AST: 15 IU/L (ref 0–40)
Albumin: 4 g/dL (ref 3.5–5.5)
Alkaline Phosphatase: 51 IU/L (ref 39–117)
BUN/Creatinine Ratio: 18 (ref 9–23)
BUN: 11 mg/dL (ref 6–24)
Bilirubin Total: 0.2 mg/dL (ref 0.0–1.2)
CALCIUM: 9 mg/dL (ref 8.7–10.2)
CO2: 17 mmol/L — AB (ref 20–29)
CREATININE: 0.6 mg/dL (ref 0.57–1.00)
Chloride: 108 mmol/L — ABNORMAL HIGH (ref 96–106)
GFR, EST AFRICAN AMERICAN: 123 mL/min/{1.73_m2} (ref >59)
GFR, EST NON AFRICAN AMERICAN: 107 mL/min/{1.73_m2} (ref >59)
Globulin, Total: 2.5 g/dL (ref 1.5–4.5)
Glucose: 88 mg/dL (ref 65–99)
POTASSIUM: 4.7 mmol/L (ref 3.5–5.2)
Sodium: 141 mmol/L (ref 134–144)
TOTAL PROTEIN: 6.5 g/dL (ref 6.0–8.5)

## 2018-02-08 LAB — HEMOGLOBIN A1C
Est. average glucose Bld gHb Est-mCnc: 103 mg/dL
HEMOGLOBIN A1C: 5.2 % (ref 4.8–5.6)

## 2018-02-08 LAB — VITAMIN D 25 HYDROXY (VIT D DEFICIENCY, FRACTURES): Vit D, 25-Hydroxy: 29.6 ng/mL — ABNORMAL LOW (ref 30.0–100.0)

## 2018-02-10 ENCOUNTER — Telehealth: Payer: Self-pay | Admitting: Obstetrics and Gynecology

## 2018-02-10 NOTE — Telephone Encounter (Signed)
Patient is returning someone's call from yesterday.   She thinks it may be regarding lab work results.

## 2018-02-13 NOTE — Telephone Encounter (Signed)
[facility] with female on home #. UTR on cell.

## 2018-02-14 NOTE — Telephone Encounter (Signed)
Patient is calling to follow up on her results. Please advise  

## 2018-02-14 NOTE — Telephone Encounter (Signed)
Done

## 2018-03-20 ENCOUNTER — Ambulatory Visit
Admission: RE | Admit: 2018-03-20 | Discharge: 2018-03-20 | Disposition: A | Discharge status: Home / Self Care | Payer: BC Managed Care – PPO | Source: Ambulatory Visit | Attending: Obstetrics and Gynecology | Admitting: Obstetrics and Gynecology

## 2018-03-20 DIAGNOSIS — Z1231 Encounter for screening mammogram for malignant neoplasm of breast: Secondary | ICD-10-CM | POA: Insufficient documentation

## 2018-03-20 DIAGNOSIS — Z1239 Encounter for other screening for malignant neoplasm of breast: Secondary | ICD-10-CM

## 2018-03-21 ENCOUNTER — Encounter: Payer: Self-pay | Admitting: Obstetrics and Gynecology

## 2018-12-29 ENCOUNTER — Other Ambulatory Visit: Payer: Self-pay | Admitting: Obstetrics and Gynecology

## 2018-12-29 DIAGNOSIS — N920 Excessive and frequent menstruation with regular cycle: Secondary | ICD-10-CM

## 2018-12-29 DIAGNOSIS — Z3041 Encounter for surveillance of contraceptive pills: Secondary | ICD-10-CM

## 2019-01-01 ENCOUNTER — Other Ambulatory Visit: Payer: Self-pay | Admitting: Obstetrics and Gynecology

## 2019-01-01 ENCOUNTER — Telehealth: Payer: Self-pay

## 2019-01-01 DIAGNOSIS — Z3041 Encounter for surveillance of contraceptive pills: Secondary | ICD-10-CM

## 2019-01-01 DIAGNOSIS — N920 Excessive and frequent menstruation with regular cycle: Secondary | ICD-10-CM

## 2019-01-01 MED ORDER — NORETHINDRONE-ETH ESTRADIOL 0.5-35 MG-MCG PO TABS: ORAL_TABLET | ORAL | 0 refills | Status: DC

## 2019-01-01 NOTE — Telephone Encounter (Signed)
[place] faxed over a 90 day refill request for Nortrel 28.   Pt isn't due for annual until 02/2019. Please advise

## 2019-01-01 NOTE — Telephone Encounter (Signed)
Rx eRxd.  

## 2019-02-20 NOTE — Progress Notes (Signed)
Chief Complaint  Patient presents with  . Annual Exam    no problems to review      HPI:      [name] is a 53 y.o. [id number] who LMP was No LMP recorded. (Menstrual status: Irregular Periods)., presents today for her annual examination.  Her menses are almost daily light spotting with continuous dosing of OCPs for cycle control due to large leio.  Dysmenorrhea none.  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 She saw Dr. Kenton Kingfisher in 2014 who recommended lupron to shrink the fibroids before hyst, but that was too expensive and pt not interested. Her bleeding is controlled with OCPs. There is concern about the size of her uterus. Pt also had an umbilical hernia that complicated the situation and this was surgically repaired emergently 10/18. Pt states she feels much better.   Sex activity: not sexually active.  Last Pap: 01/27/17 Results were: no abnormalities /neg HPV DNA . 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 The patient does not do self-breast exams.  Tobacco use: The patient denies current or previous tobacco use. Alcohol use: none Exercise: moderately active  She does get adequate calcium and Vitamin D in her diet.  She had borderline LDL 6/19 and Vit D deficiency. She is taking Vit D supp, lipid meds not indicated. She has lost wt with wt watchers. Labs due next yr.    Past Medical History:  Diagnosis Date  . Anemia 10/20/2011   d/t chronic blood loss  . Anxiety   . ASCUS with positive high risk HPV cervical 09/18/2008   ascus; hpv +  . Fibroid 2014   c menorrhagia  . History of mammography, diagnostic 02/24/2015; 03/12/2016   BIRAD 2; NEG  . History of Papanicolaou smear of cervix 12/25/2013   -/-  . Menorrhagia 10/24/2012  . Umbilical  hernia 45/40/9811   REDUCIBLE    Past Surgical History:  Procedure Laterality Date  . CERVICAL BIOPSY  W/ LOOP ELECTRODE EXCISION  04/04/2009  . [place]  . COLONOSCOPY  2010  . 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 LEEP  2010  . UMBILICAL HERNIA REPAIR N/A 06/20/2017   Procedure: HERNIA REPAIR UMBILICAL ADULT;  Surgeon: Olean Ree, MD;  Location: [facility] ORS;  Service: General;  Laterality: N/A;    Family History  Adopted: Yes    Social History   Socioeconomic History  . Marital status: Single    Spouse name: Not on file  . Number of children: Not on file  . Years of education: Not on file  . Highest education level: Not on file  Occupational History  . Not on file  Social Needs  . Financial resource strain: Not on file  . Food insecurity    Worry: Not on file    Inability: Not on file  . Transportation needs    Medical: Not on file    Non-medical: Not on file  Tobacco Use  . Smoking status: Never Smoker  . Smokeless tobacco: Never Used  Substance and Sexual Activity  . Alcohol use: No  . Drug use: No  . Sexual activity: Not Currently    Birth control/protection: Pill  Lifestyle  . Physical activity  Days per week: Not on file    Minutes per session: Not on file  . Stress: Not on file  Relationships  . Social Herbalist on phone: Not on file    Gets together: Not on file    Attends religious service: Not on file    Active member of club or organization: Not on file    Attends meetings of clubs or organizations: Not on file    Relationship status: Not on file  . Intimate partner violence    Fear of current or ex partner: Not on file    Emotionally abused: Not on file    Physically abused: Not on file    Forced sexual activity: Not on file  Other Topics Concern  . Not on file  Social History Narrative  . Not on file    Current Outpatient Medications on File Prior to Visit  Medication Sig Dispense Refill  . Multiple Vitamin  (MULTIVITAMIN) tablet Take 1 tablet by mouth daily. 28 tablet 4  . Probiotic Product (PROBIOTIC-10) CAPS Take by mouth.     No current facility-administered medications on file prior to visit.     ROS:  Review of Systems  Constitutional: Negative for fatigue, fever and unexpected weight change.  Respiratory: Negative for cough, shortness of breath and wheezing.   Cardiovascular: Negative for chest pain, palpitations and leg swelling.  Gastrointestinal: Negative for blood in stool, constipation, diarrhea, nausea and vomiting.  Endocrine: Negative for cold intolerance, heat intolerance and polyuria.  Genitourinary: Negative for dyspareunia, dysuria, flank pain, frequency, genital sores, hematuria, menstrual problem, pelvic pain, urgency, vaginal bleeding, vaginal discharge and vaginal pain.  Musculoskeletal: Negative for back pain, joint swelling and myalgias.  Skin: Negative for rash.  Neurological: Negative for dizziness, syncope, light-headedness, numbness and headaches.  Hematological: Negative for adenopathy.  Psychiatric/Behavioral: Negative for agitation, confusion, sleep disturbance and suicidal ideas. The patient is not nervous/anxious.      Objective: BP 126/82   Pulse 83   Ht 5\' 3"  (1.6 m)   Wt 162 lb (73.5 kg)   SpO2 98%   BMI 28.70 kg/m    Physical Exam Constitutional:      Appearance: She is well-developed.  Genitourinary:     Vulva, vagina, cervix, right adnexa and left adnexa normal.     No vaginal discharge, erythema or tenderness.     No cervical polyp.     Uterus is enlarged.     Uterus is not tender.     Uterine mass present.    No right or left adnexal mass present.     Right adnexa not tender.     Left adnexa not tender.  Neck:     Musculoskeletal: Normal range of motion.     Thyroid: No thyromegaly.  Cardiovascular:     Rate and Rhythm: Normal rate and regular rhythm.     Heart sounds: Normal heart sounds. No murmur.  Pulmonary:     Effort:  Pulmonary effort is normal.     Breath sounds: Normal breath sounds.  Chest:     Breasts:        Right: No mass, nipple discharge, skin change or tenderness.        Left: No mass, nipple discharge, skin change or tenderness.  Abdominal:     Palpations: Abdomen is soft. There is mass.     Tenderness: There is no abdominal tenderness. There is no guarding.       Comments: ~23 WK  SIZE UTERUS DUE TO LEIO  Musculoskeletal: Normal range of motion.  Neurological:     Mental Status: She is alert and oriented to person, place, and time.     Cranial Nerves: No cranial nerve deficit.  Psychiatric:        Behavior: Behavior normal.  Vitals signs reviewed.     Assessment/Plan: Encounter for annual routine gynecological examination -   Screening for breast cancer - Plan: MM 3D SCREEN BREAST BILATERAL, Pt to sched mammo  Encounter for surveillance of contraceptive pills - Plan: norethindrone-ethinyl estradiol (NORTREL 0.5/35, 28,) 0.5-35 MG-MCG tablet, Has light, almost daily spotting. Doesn't want to try placebo pills due to menorrhagia sx. Will re-eval bleeding next yr.  Menorrhagia with regular cycle - Due to leio, controlled with OCPs.  - Plan: norethindrone-ethinyl estradiol (NORTREL 0.5/35, 28,) 0.5-35 MG-MCG tablet,   Intramural leiomyoma of uterus - Plan: Stable in size but large. Pt declines intervention/surg/lupron. F/u next yr/sooner prn.  Screening for colon cancer - Plan: Cologuard, Pt would like to do cologuard. Ref sent.  Need for Tdap vaccination - Plan: Tdap (BOOSTRIX) injection 0.5 mL,   Meds ordered this encounter  Medications  . Tdap (BOOSTRIX) injection 0.5 mL  . norethindrone-ethinyl estradiol (NORTREL 0.5/35, 28,) 0.5-35 MG-MCG tablet    Sig: TAKE 1 TABLET BY MOUTH ONCE DAILY CONTINUOUS DOSING    Dispense:  84 tablet    Refill:  4    Order Specific Question:   Supervising Provider    Answer:   Gae Dry [[id number]]             GYN counsel breast self exam,  mammography screening, menopause, adequate intake of calcium and vitamin D, diet and exercise     F/U  1 yr annual  Emira Eubanks B. Malynn Lucy, PA-C 02/21/2019 2:45 PM

## 2019-02-20 NOTE — Patient Instructions (Addendum)
I value your feedback and entrusting us with your care. If you get a [facility] patient survey, I would appreciate you taking the time to let us know about your experience today. Thank you!  [facility]: [phone]    

## 2019-02-21 ENCOUNTER — Encounter: Payer: Self-pay | Admitting: Obstetrics and Gynecology

## 2019-02-21 ENCOUNTER — Ambulatory Visit (INDEPENDENT_AMBULATORY_CARE_PROVIDER_SITE_OTHER): Payer: BC Managed Care – PPO | Admitting: Obstetrics and Gynecology

## 2019-02-21 ENCOUNTER — Other Ambulatory Visit: Payer: Self-pay

## 2019-02-21 VITALS — BP 126/82 | HR 83 | Ht 63.0 in | Wt 162.0 lb

## 2019-02-21 DIAGNOSIS — Z01419 Encounter for gynecological examination (general) (routine) without abnormal findings: Secondary | ICD-10-CM | POA: Diagnosis not present

## 2019-02-21 DIAGNOSIS — Z23 Encounter for immunization: Secondary | ICD-10-CM

## 2019-02-21 DIAGNOSIS — N920 Excessive and frequent menstruation with regular cycle: Secondary | ICD-10-CM

## 2019-02-21 DIAGNOSIS — Z3041 Encounter for surveillance of contraceptive pills: Secondary | ICD-10-CM

## 2019-02-21 DIAGNOSIS — Z1211 Encounter for screening for malignant neoplasm of colon: Secondary | ICD-10-CM

## 2019-02-21 DIAGNOSIS — Z1239 Encounter for other screening for malignant neoplasm of breast: Secondary | ICD-10-CM

## 2019-02-21 DIAGNOSIS — D251 Intramural leiomyoma of uterus: Secondary | ICD-10-CM

## 2019-02-21 MED ORDER — TETANUS-DIPHTH-ACELL PERTUSSIS 5-2.5-18.5 LF-MCG/0.5 IM SUSP
0.5000 mL | Freq: Once | INTRAMUSCULAR | Status: AC
  Administered 2019-02-21: 0.5 mL via INTRAMUSCULAR

## 2019-02-21 MED ORDER — NORTREL 0.5/35 (28) 0.5-35 MG-MCG PO TABS: ORAL_TABLET | ORAL | 4 refills | Status: DC

## 2019-02-21 NOTE — Progress Notes (Signed)
Patient has had abnormal bleeding for over five years. She would like to get TDAP today no record of last one in chart or [facility].

## 2019-03-30 ENCOUNTER — Ambulatory Visit
Admission: RE | Admit: 2019-03-30 | Discharge: 2019-03-30 | Disposition: A | Discharge status: Home / Self Care | Payer: BC Managed Care – PPO | Source: Ambulatory Visit | Attending: Obstetrics and Gynecology | Admitting: Obstetrics and Gynecology

## 2019-03-30 ENCOUNTER — Other Ambulatory Visit: Payer: Self-pay

## 2019-03-30 DIAGNOSIS — Z1239 Encounter for other screening for malignant neoplasm of breast: Secondary | ICD-10-CM

## 2019-03-30 DIAGNOSIS — Z1231 Encounter for screening mammogram for malignant neoplasm of breast: Secondary | ICD-10-CM | POA: Insufficient documentation

## 2019-04-01 ENCOUNTER — Encounter: Payer: Self-pay | Admitting: Obstetrics and Gynecology

## 2019-04-23 ENCOUNTER — Telehealth: Payer: Self-pay

## 2019-04-23 NOTE — Telephone Encounter (Signed)
Patient is returning missed call. Please advise 

## 2019-04-23 NOTE — Telephone Encounter (Signed)
Per [facility] called pt to follow up on Cologuard testing, no answer, could not leave voice msg due to mail box not set up.

## 2019-04-23 NOTE — Telephone Encounter (Signed)
Pt returned phone call, says she hasnt received test kit. She will call them again to check whats the hold up.

## 2019-06-25 NOTE — Telephone Encounter (Signed)
Called pt to follow up on cologuard test, says she has received kit but hasnt had time to do it. She is working a lot but she does intend to do it.

## 2019-11-21 IMAGING — MG DIGITAL SCREENING BILATERAL MAMMOGRAM WITH TOMO AND CAD
8 series · 8 of 24 positions shown · non-contrast
Comparison: Previous exam(s).

CLINICAL DATA: Screening.

EXAM:
DIGITAL SCREENING BILATERAL MAMMOGRAM WITH TOMO AND CAD

[R MLO synth-2D]
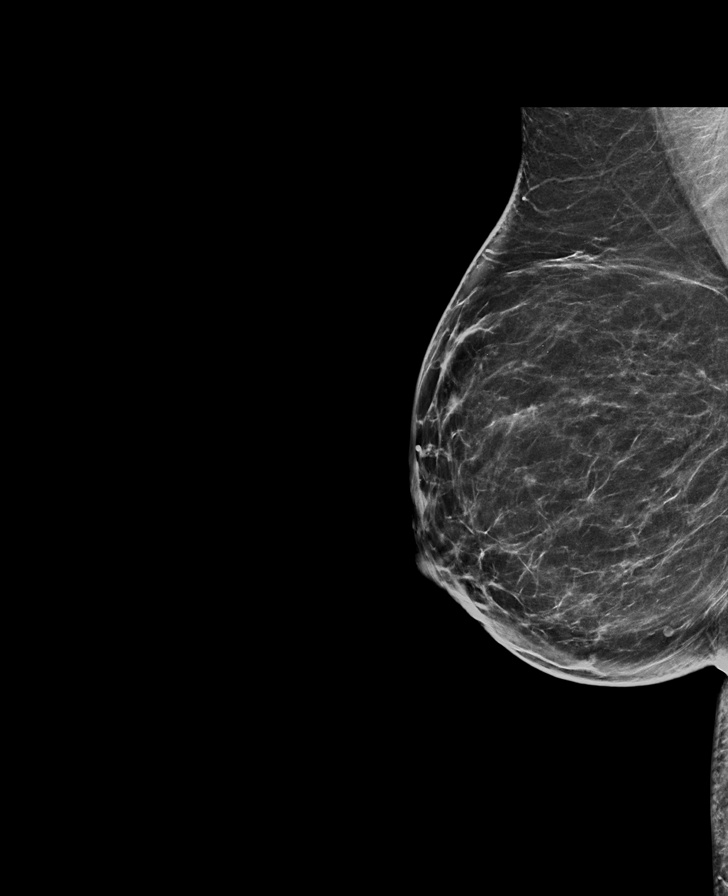

[L CC synth-2D]
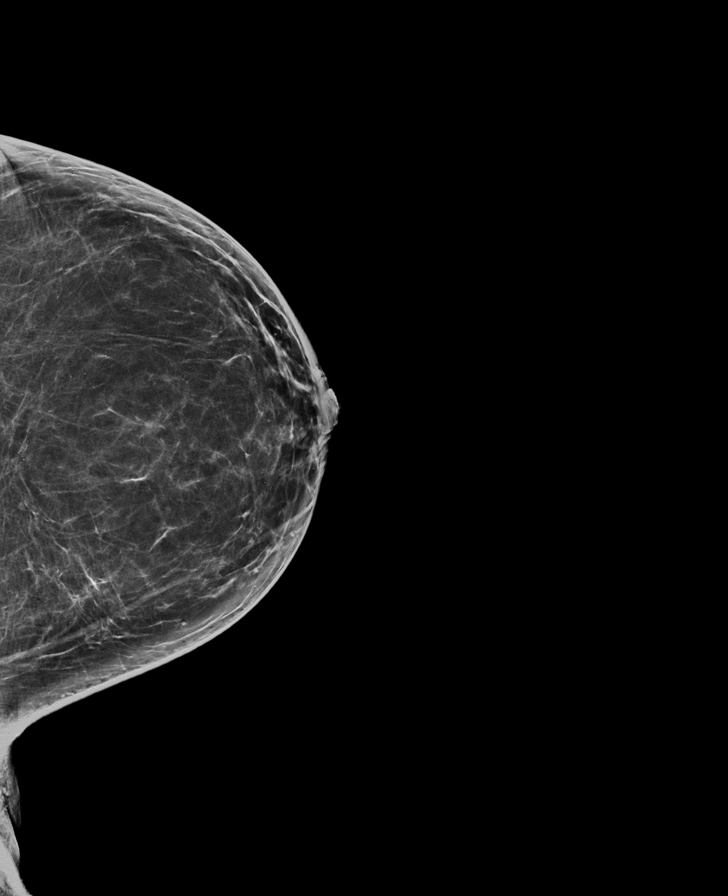

[L MLO synth-2D]
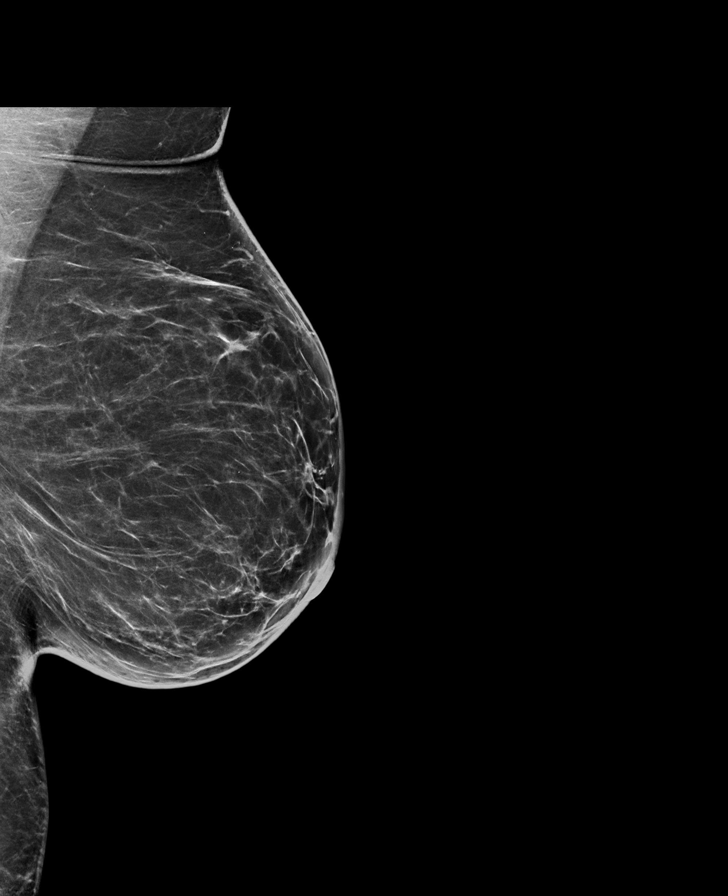

[R CC synth-2D]
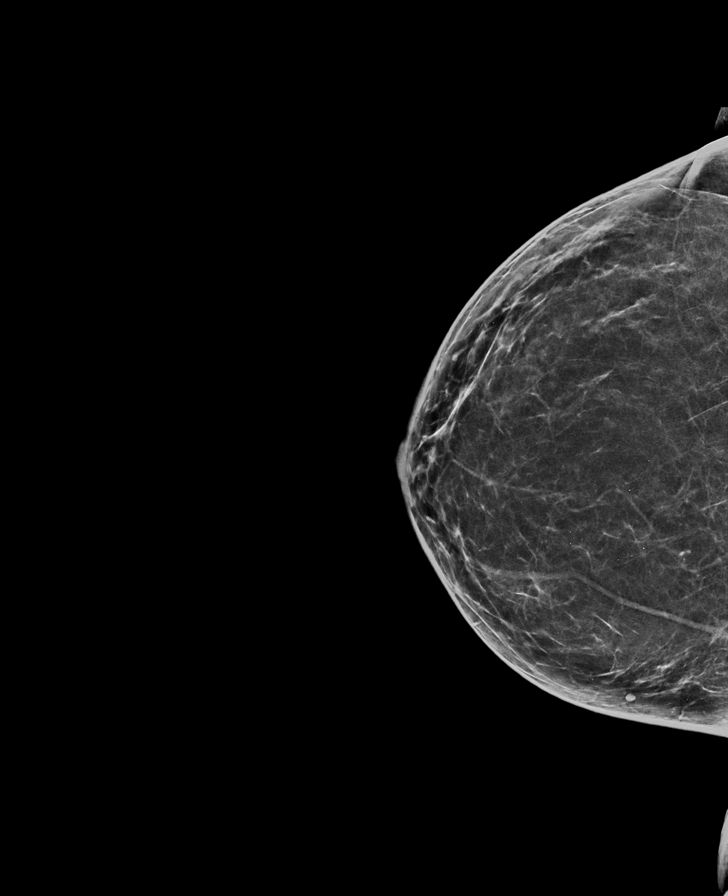

[R MLO tomo · tomo slice 36/71.0]
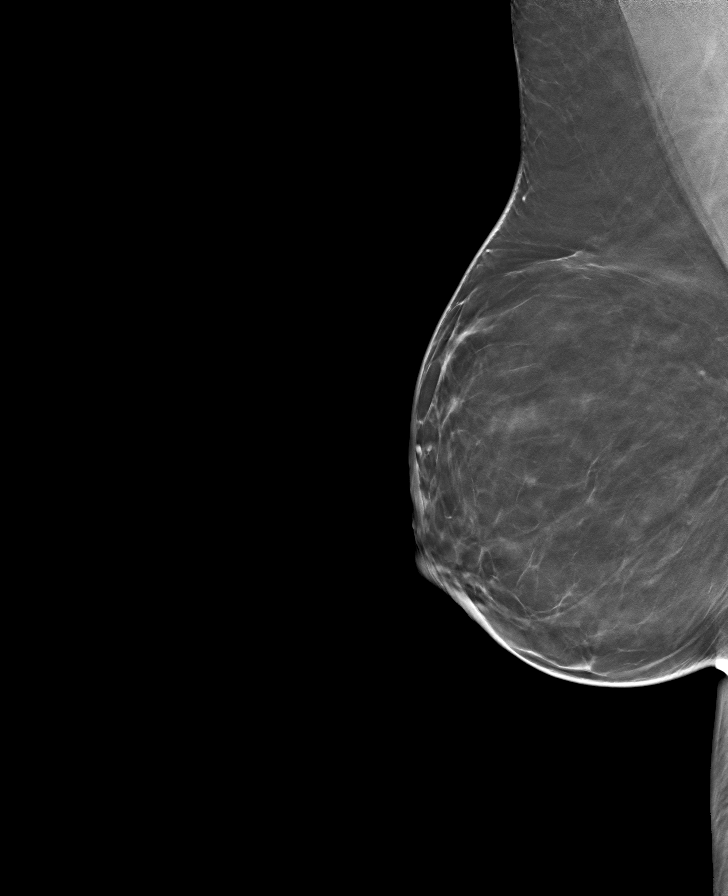

[L MLO tomo · tomo slice 39/78.0]
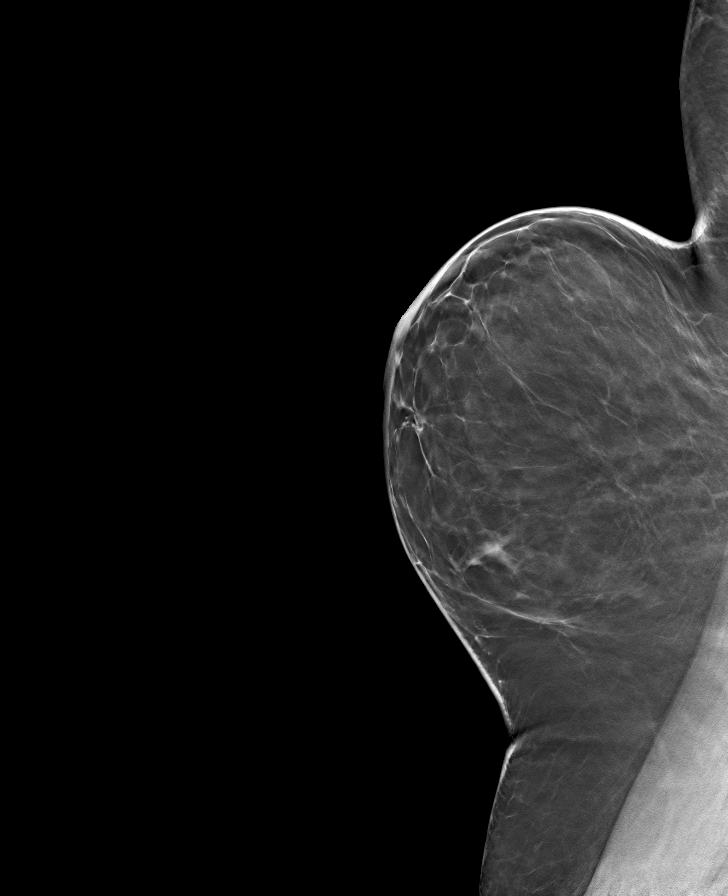

[R CC tomo · tomo slice 33/65.0]
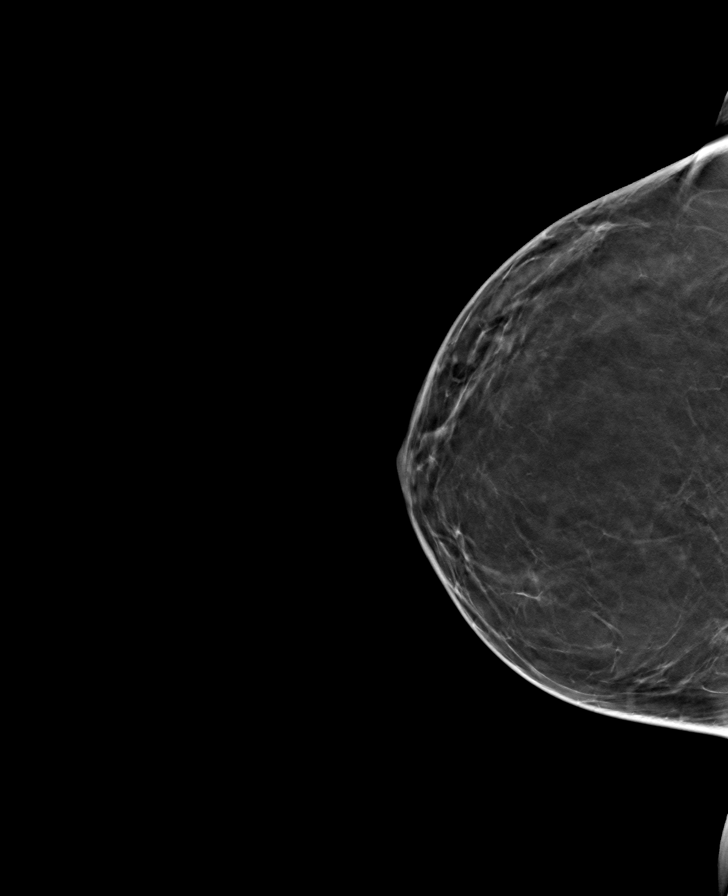

[L CC tomo · tomo slice 37/72.0]
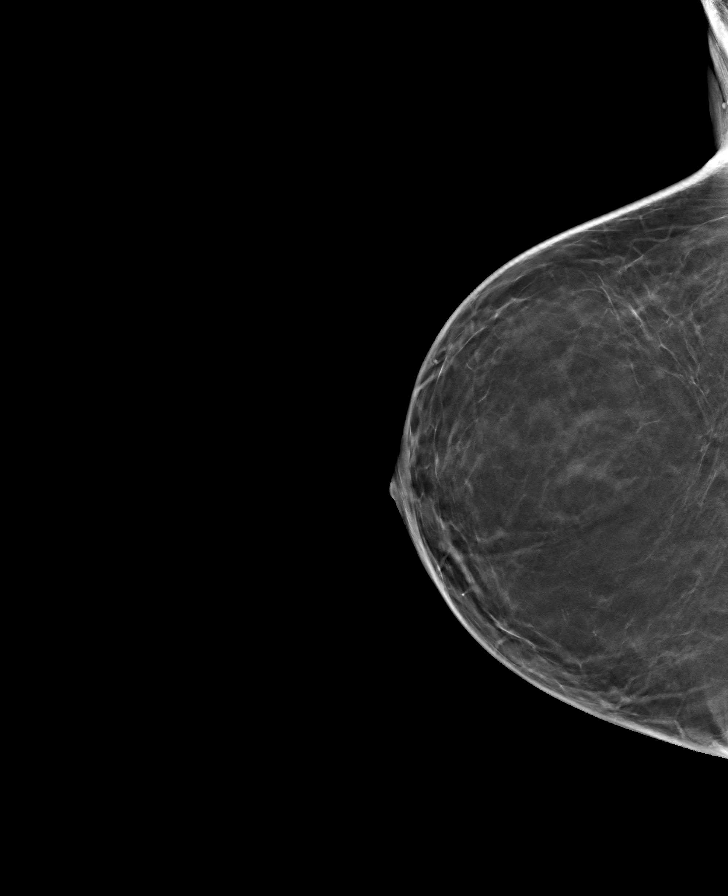

[8 of 24 positions shown; findings below may reference images not displayed]

ACR Breast Density Category b: There are scattered areas of
fibroglandular density.
FINDINGS: There are no findings suspicious for malignancy. Images were
processed with CAD.
IMPRESSION: No mammographic evidence of malignancy. A result letter of this
screening mammogram will be mailed directly to the patient.

RECOMMENDATION:
Screening mammogram in one year. (Code:[id number]-775)

BI-RADS CATEGORY  1: Negative.

## 2020-01-11 ENCOUNTER — Telehealth: Payer: Self-pay | Admitting: Obstetrics and Gynecology

## 2020-01-11 DIAGNOSIS — Z3041 Encounter for surveillance of contraceptive pills: Secondary | ICD-10-CM

## 2020-01-11 DIAGNOSIS — N920 Excessive and frequent menstruation with regular cycle: Secondary | ICD-10-CM

## 2020-01-11 MED ORDER — NORTREL 0.5/35 (28) 0.5-35 MG-MCG PO TABS: ORAL_TABLET | ORAL | 0 refills | Status: DC

## 2020-01-11 NOTE — Telephone Encounter (Signed)
RF sent.

## 2020-01-11 NOTE — Telephone Encounter (Signed)
Pt will run out of meds before ean. She actually runs out tomorrow. Please send in ref[facility] to [facility] in [place].

## 2020-02-19 ENCOUNTER — Other Ambulatory Visit: Payer: Self-pay | Admitting: Obstetrics and Gynecology

## 2020-02-19 DIAGNOSIS — Z1231 Encounter for screening mammogram for malignant neoplasm of breast: Secondary | ICD-10-CM

## 2020-02-21 LAB — COLOGUARD

## 2020-03-04 ENCOUNTER — Other Ambulatory Visit: Payer: Self-pay

## 2020-03-04 ENCOUNTER — Other Ambulatory Visit (HOSPITAL_COMMUNITY)
Admission: RE | Admit: 2020-03-04 | Discharge: 2020-03-04 | Disposition: A | Discharge status: Home / Self Care | Payer: BC Managed Care – PPO | Source: Ambulatory Visit | Attending: Obstetrics and Gynecology | Admitting: Obstetrics and Gynecology

## 2020-03-04 ENCOUNTER — Ambulatory Visit (INDEPENDENT_AMBULATORY_CARE_PROVIDER_SITE_OTHER): Payer: BC Managed Care – PPO | Admitting: Obstetrics and Gynecology

## 2020-03-04 ENCOUNTER — Encounter: Payer: Self-pay | Admitting: Obstetrics and Gynecology

## 2020-03-04 VITALS — BP 120/90 | Ht 63.0 in | Wt 166.0 lb

## 2020-03-04 DIAGNOSIS — Z3041 Encounter for surveillance of contraceptive pills: Secondary | ICD-10-CM

## 2020-03-04 DIAGNOSIS — Z01419 Encounter for gynecological examination (general) (routine) without abnormal findings: Secondary | ICD-10-CM | POA: Diagnosis not present

## 2020-03-04 DIAGNOSIS — Z124 Encounter for screening for malignant neoplasm of cervix: Secondary | ICD-10-CM | POA: Insufficient documentation

## 2020-03-04 DIAGNOSIS — D251 Intramural leiomyoma of uterus: Secondary | ICD-10-CM

## 2020-03-04 DIAGNOSIS — Z1231 Encounter for screening mammogram for malignant neoplasm of breast: Secondary | ICD-10-CM

## 2020-03-04 DIAGNOSIS — Z1151 Encounter for screening for human papillomavirus (HPV): Secondary | ICD-10-CM | POA: Diagnosis present

## 2020-03-04 DIAGNOSIS — N871 Moderate cervical dysplasia: Secondary | ICD-10-CM | POA: Insufficient documentation

## 2020-03-04 DIAGNOSIS — Z13220 Encounter for screening for lipoid disorders: Secondary | ICD-10-CM

## 2020-03-04 DIAGNOSIS — Z131 Encounter for screening for diabetes mellitus: Secondary | ICD-10-CM

## 2020-03-04 DIAGNOSIS — Z0000 Encounter for general adult medical examination without abnormal findings: Secondary | ICD-10-CM

## 2020-03-04 DIAGNOSIS — Z1211 Encounter for screening for malignant neoplasm of colon: Secondary | ICD-10-CM

## 2020-03-04 DIAGNOSIS — N920 Excessive and frequent menstruation with regular cycle: Secondary | ICD-10-CM

## 2020-03-04 MED ORDER — NORTREL 0.5/35 (28) 0.5-35 MG-MCG PO TABS: ORAL_TABLET | ORAL | 3 refills | Status: DC

## 2020-03-04 NOTE — Patient Instructions (Signed)
I value your feedback and entrusting us with your care. If you get a [facility] patient survey, I would appreciate you taking the time to let us know about your experience today. Thank you! ° °As of August 09, 2019, your lab results will be released to your MyChart immediately, before I even have a chance to see them. Please give me time to review them and contact you if there are any abnormalities. Thank you for your patience.  ° °[facility]: [phone] ° ° ° °

## 2020-03-04 NOTE — Progress Notes (Signed)
Chief Complaint  Patient presents with  . Gynecologic Exam      HPI:      [name] is a 54 y.o. [phone] who LMP was No LMP recorded (lmp unknown). (Menstrual status: Irregular Periods)., presents today for her annual examination.  Her menses are almost daily light spotting with continuous dosing of OCPs for cycle control due to large leio.  Dysmenorrhea not usually unless has heavier day (changing products BID).  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 She saw Dr. Kenton Kingfisher in 2014 who recommended lupron to shrink the fibroids before hyst, but that was too expensive and pt not interested. Her bleeding is improved but not controlled with OCPs. There is concern about the size of her uterus. Pt denies urinary and fecal incont, no pelvic pain. Pt also had an umbilical hernia that complicated the situation and this was surgically repaired emergently 10/18. Pt states she feels much better.   Sex activity: not sexually active.  Last Pap: 01/27/17 Results were: no abnormalities /neg HPV DNA . 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 The patient does not do self-breast exams.  Tobacco use: The patient denies current or previous tobacco use. Alcohol use: none No drug use. Exercise: moderately active  She does get adequate calcium and Vitamin D in her diet.  She had borderline LDL and slightly elevated TGs  6/19 and Vit D deficiency. She is taking Vit D supp, lipid meds not indicated. Labs due.  Colonoscopy: interested in Cologuard last yr and has kit, but insurance wasn't covering it. Plans to call insurance again this yr to check coverage. If covered, can submit sample. I may need to put in new order.   Past Medical History:  Diagnosis Date  . Anemia  10/20/2011   d/t chronic blood loss  . Anxiety   . ASCUS with positive high risk HPV cervical 09/18/2008   ascus; hpv +  . Fibroid 2014   c menorrhagia  . History of mammography, diagnostic 02/24/2015; 03/12/2016   BIRAD 2; NEG  . History of Papanicolaou smear of cervix 12/25/2013   -/-  . Menorrhagia 10/24/2012  . Umbilical hernia 14/78/2956   REDUCIBLE    Past Surgical History:  Procedure Laterality Date  . CERVICAL BIOPSY  W/ LOOP ELECTRODE EXCISION  04/04/2009  . [place]  . COLONOSCOPY  2010  . 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 LEEP  2010  . UMBILICAL HERNIA REPAIR N/A 06/20/2017   Procedure: HERNIA REPAIR UMBILICAL ADULT;  Surgeon: Olean Ree, MD;  Location: [facility] ORS;  Service: General;  Laterality: N/A;    Family History  Adopted: Yes  Problem Relation Age of Onset  . Breast cancer Neg Hx     Social History   Socioeconomic History  . Marital status: Single    Spouse name: Not on file  . Number of children: Not on file  . Years of education: Not on file  . Highest education level: Not on file  Occupational History  . Not on file  Tobacco Use  . Smoking status: Never Smoker  . Smokeless tobacco: Never Used  Vaping Use  . Vaping Use: Never used  Substance and Sexual Activity  .  Alcohol use: No  . Drug use: No  . Sexual activity: Not Currently    Birth control/protection: Pill  Other Topics Concern  . Not on file  Social History Narrative  . Not on file   Social Determinants of Health   Financial Resource Strain:   . Difficulty of Paying Living Expenses:   Food Insecurity:   . Worried About Charity fundraiser in the Last Year:   . Arboriculturist in the Last Year:   Transportation Needs:   . Film/video editor (Medical):   Marland Kitchen Lack of Transportation (Non-Medical):   Physical Activity:   . Days of Exercise per Week:   . Minutes of Exercise per Session:   Stress:   . Feeling of Stress :   Social Connections:   . Frequency  of Communication with Friends and Family:   . Frequency of Social Gatherings with Friends and Family:   . Attends Religious Services:   . Active Member of Clubs or Organizations:   . Attends Archivist Meetings:   Marland Kitchen Marital Status:   Intimate Partner Violence:   . Fear of Current or Ex-Partner:   . Emotionally Abused:   Marland Kitchen Physically Abused:   . Sexually Abused:     Current Outpatient Medications on File Prior to Visit  Medication Sig Dispense Refill  . Multiple Vitamin (MULTIVITAMIN) tablet Take 1 tablet by mouth daily. 28 tablet 4  . Probiotic Product (PROBIOTIC-10) CAPS Take by mouth.     No current facility-administered medications on file prior to visit.    ROS:  Review of Systems  Constitutional: Negative for fatigue, fever and unexpected weight change.  Respiratory: Negative for cough, shortness of breath and wheezing.   Cardiovascular: Negative for chest pain, palpitations and leg swelling.  Gastrointestinal: Negative for blood in stool, constipation, diarrhea, nausea and vomiting.  Endocrine: Negative for cold intolerance, heat intolerance and polyuria.  Genitourinary: Negative for dyspareunia, dysuria, flank pain, frequency, genital sores, hematuria, menstrual problem, pelvic pain, urgency, vaginal bleeding, vaginal discharge and vaginal pain.  Musculoskeletal: Negative for back pain, joint swelling and myalgias.  Skin: Negative for rash.  Neurological: Negative for dizziness, syncope, light-headedness, numbness and headaches.  Hematological: Negative for adenopathy.  Psychiatric/Behavioral: Negative for agitation, confusion, sleep disturbance and suicidal ideas. The patient is not nervous/anxious.      Objective: BP 120/90   Ht 5' 3"  (1.6 m)   Wt 166 lb (75.3 kg)   LMP  (LMP Unknown)   BMI 29.41 kg/m    Physical Exam Constitutional:      Appearance: She is well-developed.  Genitourinary:     Vulva, inguinal canal, urethra, vagina, cervix, right  adnexa and left adnexa normal.     No vaginal discharge, erythema or tenderness.     No cervical polyp.     Uterus is enlarged.     Uterus is not tender.     Uterine mass present.    No right or left adnexal mass present.     Right adnexa not tender.     Left adnexa not tender.  Neck:     Thyroid: No thyromegaly.  Cardiovascular:     Rate and Rhythm: Normal rate and regular rhythm.     Heart sounds: Normal heart sounds. No murmur heard.   Pulmonary:     Effort: Pulmonary effort is normal.     Breath sounds: Normal breath sounds.  Chest:     Breasts:  Right: No mass, nipple discharge, skin change or tenderness.        Left: No mass, nipple discharge, skin change or tenderness.  Abdominal:     Palpations: Abdomen is soft. There is mass.     Tenderness: There is no abdominal tenderness. There is no guarding.       Comments: ~26 WK SIZE UTERUS DUE TO LEIO  Musculoskeletal:        General: Normal range of motion.     Cervical back: Normal range of motion.  Neurological:     Mental Status: She is alert and oriented to person, place, and time.     Cranial Nerves: No cranial nerve deficit.  Psychiatric:        Behavior: Behavior normal.  Vitals reviewed.     Assessment/Plan: Encounter for annual routine gynecological examination  Cervical cancer screening - Plan: Cytology - PAP  Screening for HPV (human papillomavirus) - Plan: Cytology - PAP  Dysplasia of cervix, high grade CIN 2 - Plan: Cytology - PAP  Encounter for screening mammogram for malignant neoplasm of breast - Plan: MM 3D SCREEN BREAST BILATERAL; pt to sched mammo  Menorrhagia with regular cycle - Plan: US PELVIS TRANSVAGINAL NON-OB (TV ONLY), norethindrone-ethinyl estradiol (NORTREL 0.5/35, 28,) 0.5-35 MG-MCG tablet, CBC with Differential/Platelet; Rx RF OCPs. Cont for now. Check CBC  Encounter for surveillance of contraceptive pills - Plan: norethindrone-ethinyl estradiol (NORTREL 0.5/35, 28,) 0.5-35  MG-MCG tablet  Intramural leiomyoma of uterus - Plan: US PELVIS TRANSVAGINAL NON-OB (TV ONLY), norethindrone-ethinyl estradiol (NORTREL 0.5/35, 28,) 0.5-35 MG-MCG tablet; Check GYN u/s for stability of leio. Seems larger this yr. Discussed lupron with hyst but also [place]. Pt to consider ref to [facility]  Screening for colon cancer--pt to check ins with cologuard and submit specimen if covered.   Blood tests for routine general physical examination - Plan: Comprehensive metabolic panel, Lipid panel, Hemoglobin A1c, CBC with Differential/Platelet  Screening cholesterol level - Plan: Lipid panel  Screening for diabetes mellitus - Plan: Hemoglobin A1c   Meds ordered this encounter  Medications  . norethindrone-ethinyl estradiol (NORTREL 0.5/35, 28,) 0.5-35 MG-MCG tablet    Sig: TAKE 1 TABLET BY MOUTH ONCE DAILY CONTINUOUS DOSING    Dispense:  84 tablet    Refill:  3    Order Specific Question:   Supervising Provider    Answer:   Gae Dry [[id number]]             GYN counsel breast self exam, mammography screening, menopause, adequate intake of calcium and vitamin D, diet and exercise     F/U  Return in about 1 week (around 03/11/2020) for GYN u/s for leio with fasting labs--[facility] to call pt./ 1 yr annual   Macayla Ekdahl B. Kloie Whiting, PA-C 03/04/2020 2:49 PM

## 2020-03-07 LAB — CYTOLOGY - PAP
Adequacy: ABSENT
Comment: NEGATIVE
Diagnosis: NEGATIVE
High risk HPV: NEGATIVE

## 2020-03-13 ENCOUNTER — Other Ambulatory Visit: Payer: Self-pay

## 2020-03-13 ENCOUNTER — Encounter: Payer: Self-pay | Admitting: Obstetrics and Gynecology

## 2020-03-13 ENCOUNTER — Ambulatory Visit (INDEPENDENT_AMBULATORY_CARE_PROVIDER_SITE_OTHER): Payer: BC Managed Care – PPO

## 2020-03-13 ENCOUNTER — Other Ambulatory Visit: Payer: Self-pay | Admitting: Obstetrics and Gynecology

## 2020-03-13 ENCOUNTER — Other Ambulatory Visit: Payer: BC Managed Care – PPO

## 2020-03-13 DIAGNOSIS — D251 Intramural leiomyoma of uterus: Secondary | ICD-10-CM

## 2020-03-13 DIAGNOSIS — N920 Excessive and frequent menstruation with regular cycle: Secondary | ICD-10-CM

## 2020-03-13 DIAGNOSIS — Z131 Encounter for screening for diabetes mellitus: Secondary | ICD-10-CM

## 2020-03-13 DIAGNOSIS — Z13220 Encounter for screening for lipoid disorders: Secondary | ICD-10-CM

## 2020-03-13 DIAGNOSIS — Z0000 Encounter for general adult medical examination without abnormal findings: Secondary | ICD-10-CM

## 2020-03-14 LAB — COMPREHENSIVE METABOLIC PANEL
ALT: 9 IU/L (ref 0–32)
AST: 14 IU/L (ref 0–40)
Albumin/Globulin Ratio: 1.6 (ref 1.2–2.2)
Albumin: 4.4 g/dL (ref 3.8–4.9)
Alkaline Phosphatase: 54 IU/L (ref 48–121)
BUN/Creatinine Ratio: 15 (ref 9–23)
BUN: 9 mg/dL (ref 6–24)
Bilirubin Total: 0.2 mg/dL (ref 0.0–1.2)
CO2: 22 mmol/L (ref 20–29)
Calcium: 9.5 mg/dL (ref 8.7–10.2)
Chloride: 102 mmol/L (ref 96–106)
Creatinine, Ser: 0.62 mg/dL (ref 0.57–1.00)
GFR calc Af Amer: 120 mL/min/{1.73_m2} (ref >59)
GFR calc non Af Amer: 104 mL/min/{1.73_m2} (ref >59)
Globulin, Total: 2.8 g/dL (ref 1.5–4.5)
Glucose: 83 mg/dL (ref 65–99)
Potassium: 4.6 mmol/L (ref 3.5–5.2)
Sodium: 139 mmol/L (ref 134–144)
Total Protein: 7.2 g/dL (ref 6.0–8.5)

## 2020-03-14 LAB — CBC WITH DIFFERENTIAL/PLATELET
Basophils Absolute: 0 10*3/uL (ref 0.0–0.2)
Basos: 1 %
EOS (ABSOLUTE): 0.1 10*3/uL (ref 0.0–0.4)
Eos: 2 %
Hematocrit: 39.2 % (ref 34.0–46.6)
Hemoglobin: 12.7 g/dL (ref 11.1–15.9)
Immature Grans (Abs): 0 10*3/uL (ref 0.0–0.1)
Immature Granulocytes: 0 %
Lymphocytes Absolute: 1.5 10*3/uL (ref 0.7–3.1)
Lymphs: 32 %
MCH: 30.1 pg (ref 26.6–33.0)
MCHC: 32.4 g/dL (ref 31.5–35.7)
MCV: 93 fL (ref 79–97)
Monocytes Absolute: 0.4 10*3/uL (ref 0.1–0.9)
Monocytes: 9 %
Neutrophils Absolute: 2.5 10*3/uL (ref 1.4–7.0)
Neutrophils: 56 %
Platelets: 293 10*3/uL (ref 150–450)
RBC: 4.22 x10E6/uL (ref 3.77–5.28)
RDW: 13.3 % (ref 11.7–15.4)
WBC: 4.5 10*3/uL (ref 3.4–10.8)

## 2020-03-14 LAB — LIPID PANEL
Chol/HDL Ratio: 4.8 ratio — ABNORMAL HIGH (ref 0.0–4.4)
Cholesterol, Total: 237 mg/dL — ABNORMAL HIGH (ref 100–199)
HDL: 49 mg/dL (ref >39)
LDL Chol Calc (NIH): 143 mg/dL — ABNORMAL HIGH (ref 0–99)
Triglycerides: 250 mg/dL — ABNORMAL HIGH (ref 0–149)
VLDL Cholesterol Cal: 45 mg/dL — ABNORMAL HIGH (ref 5–40)

## 2020-03-14 LAB — HEMOGLOBIN A1C
Est. average glucose Bld gHb Est-mCnc: 103 mg/dL
Hgb A1c MFr Bld: 5.2 % (ref 4.8–5.6)

## 2020-03-31 ENCOUNTER — Ambulatory Visit
Admission: RE | Admit: 2020-03-31 | Discharge: 2020-03-31 | Disposition: A | Discharge status: Home / Self Care | Payer: BC Managed Care – PPO | Source: Ambulatory Visit | Attending: Obstetrics and Gynecology | Admitting: Obstetrics and Gynecology

## 2020-03-31 DIAGNOSIS — Z1231 Encounter for screening mammogram for malignant neoplasm of breast: Secondary | ICD-10-CM | POA: Diagnosis not present

## 2020-04-02 ENCOUNTER — Encounter: Payer: Self-pay | Admitting: Obstetrics and Gynecology

## 2020-07-16 ENCOUNTER — Telehealth: Payer: Self-pay | Admitting: Obstetrics and Gynecology

## 2020-07-16 DIAGNOSIS — Z1211 Encounter for screening for malignant neoplasm of colon: Secondary | ICD-10-CM

## 2020-07-16 NOTE — Telephone Encounter (Signed)
Patient calling requesting cologuard results , she said she hasn't heard anything. Test was done in August.

## 2020-07-16 NOTE — Telephone Encounter (Signed)
Looks like test expired 6/21. I don't see any results from 8/21. Can you pls cologuard to get results

## 2020-07-17 NOTE — Telephone Encounter (Signed)
New order placed. Pls fax and notify pt. Thx

## 2020-07-17 NOTE — Telephone Encounter (Signed)
Order faxed. Called pt to let her know, no answer, voice mail not set up could not leave voice msg.

## 2020-07-17 NOTE — Telephone Encounter (Signed)
Called Cologuard and was told specimen pt sent was received after expiration date (01/2020) and it was discarded. New order needs to be sent if pt wants test.

## 2020-07-17 NOTE — Telephone Encounter (Signed)
Tried again, no answer, voice mail not set up.

## 2020-09-23 NOTE — Telephone Encounter (Signed)
Called pt to follow up on Cologuard Test, is she still planning on completing test? Pt says she received new kit and is planning on completing it, needs to find the time to do so.

## 2020-11-23 ENCOUNTER — Other Ambulatory Visit: Payer: Self-pay | Admitting: Obstetrics and Gynecology

## 2020-11-23 DIAGNOSIS — N920 Excessive and frequent menstruation with regular cycle: Secondary | ICD-10-CM

## 2020-11-23 DIAGNOSIS — D251 Intramural leiomyoma of uterus: Secondary | ICD-10-CM

## 2020-11-23 DIAGNOSIS — Z3041 Encounter for surveillance of contraceptive pills: Secondary | ICD-10-CM

## 2021-01-20 ENCOUNTER — Telehealth: Payer: Self-pay

## 2021-01-20 DIAGNOSIS — D251 Intramural leiomyoma of uterus: Secondary | ICD-10-CM

## 2021-01-20 DIAGNOSIS — N920 Excessive and frequent menstruation with regular cycle: Secondary | ICD-10-CM

## 2021-01-20 DIAGNOSIS — Z3041 Encounter for surveillance of contraceptive pills: Secondary | ICD-10-CM

## 2021-01-20 NOTE — Telephone Encounter (Signed)
Pt calling for nortrel authorization to be refilled; [facility] [place].  [phone]

## 2021-01-21 NOTE — Telephone Encounter (Signed)
Voicemail not set up unable to leave message

## 2021-01-22 ENCOUNTER — Other Ambulatory Visit: Payer: Self-pay | Admitting: Obstetrics and Gynecology

## 2021-01-22 DIAGNOSIS — N920 Excessive and frequent menstruation with regular cycle: Secondary | ICD-10-CM

## 2021-01-22 DIAGNOSIS — Z3041 Encounter for surveillance of contraceptive pills: Secondary | ICD-10-CM

## 2021-01-22 DIAGNOSIS — D251 Intramural leiomyoma of uterus: Secondary | ICD-10-CM

## 2021-01-22 NOTE — Telephone Encounter (Signed)
Left another generic message. Patient has seen Mychart message but hasn't contacted office to schedule

## 2021-01-22 NOTE — Telephone Encounter (Signed)
Patient is scheduled for 03/17/21 at 11:10 with [facility]

## 2021-01-23 MED ORDER — NORTREL 0.5/35 (28) 0.5-35 MG-MCG PO TABS: ORAL_TABLET | ORAL | 0 refills | Status: DC

## 2021-01-23 NOTE — Telephone Encounter (Signed)
Refill eRx'd.

## 2021-02-23 ENCOUNTER — Other Ambulatory Visit: Payer: Self-pay | Admitting: Obstetrics and Gynecology

## 2021-02-23 DIAGNOSIS — Z1231 Encounter for screening mammogram for malignant neoplasm of breast: Secondary | ICD-10-CM

## 2021-02-27 DIAGNOSIS — R195 Other fecal abnormalities: Secondary | ICD-10-CM

## 2021-02-27 HISTORY — DX: Other fecal abnormalities: R19.5

## 2021-03-03 ENCOUNTER — Ambulatory Visit: Payer: BC Managed Care – PPO | Admitting: Obstetrics and Gynecology

## 2021-03-04 LAB — COLOGUARD: Cologuard: POSITIVE — AB

## 2021-03-06 LAB — COLOGUARD: COLOGUARD: POSITIVE — AB

## 2021-03-09 ENCOUNTER — Telehealth: Payer: Self-pay

## 2021-03-09 NOTE — Telephone Encounter (Signed)
[employer] calling to see if we recv'd abnl cologard results; ref # [id number].  Adv we did receive them.

## 2021-03-16 ENCOUNTER — Telehealth: Payer: Self-pay | Admitting: Obstetrics and Gynecology

## 2021-03-16 ENCOUNTER — Encounter: Payer: Self-pay | Admitting: Obstetrics and Gynecology

## 2021-03-16 DIAGNOSIS — R195 Other fecal abnormalities: Secondary | ICD-10-CM | POA: Insufficient documentation

## 2021-03-16 DIAGNOSIS — Z1211 Encounter for screening for malignant neoplasm of colon: Secondary | ICD-10-CM

## 2021-03-16 NOTE — Progress Notes (Signed)
Chief Complaint  Patient presents with   Gynecologic Exam    No concerns     HPI:      [name] is a 55 y.o. [id number] who LMP was No LMP recorded (lmp unknown). (Menstrual status: Irregular Periods)., presents today for her annual examination.  Her menses are almost daily light spotting with occas gushes with clots; on continuous dosing of OCPs for cycle control due to large leio.  Dysmenorrhea in back with heavier flow.  She has a very large uterus due to "Within the uterus are multiple suspected fibroids. The largest fibroid seen was 9.0 cm. There were numerous 8 to 8.5 cm fibroids. Numerous 6 to 6.5 cm fibroids and downwards." (7/21 GYN u/s). Leio larger than previous u/s 2018. Normal CBC 7/21. 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 She saw Dr. Kenton Kingfisher in 2014 who recommended lupron to shrink the fibroids before hyst, but that was too expensive and pt not interested. Discussed ref for [place] last yr but pt wasn't interested. Her bleeding is improved but not controlled with OCPs. There is concern about the size of her uterus. Pt denies urinary and fecal incont, no pelvic pain. Pt also had an umbilical hernia that complicated the situation and this was surgically repaired emergently 10/18.   Sex activity: not sexually active.  Last Pap: 03/04/20 Results were: no abnormalities /neg HPV DNA . 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 Has appt 8/22 FH unknown due to adoption. The patient does not do self-breast exams.   Tobacco use: The patient denies current or previous tobacco use. Alcohol use: none No drug use. Exercise: moderately active   She does get adequate calcium and Vitamin D in her diet.   She had borderline LDL and slightly elevated TGs  6/19, worse 7/21. Repeat Labs due.  Colonoscopy: positive cologuard last wk, referral to GI  already placed. Pt awaiting appt info.   Past Medical History:  Diagnosis Date   Anemia 10/20/2011   d/t chronic blood loss   Anxiety    ASCUS with positive high risk HPV cervical 09/18/2008   ascus; hpv +   Fibroid 2014   c menorrhagia   History of mammography, diagnostic 02/24/2015; 03/12/2016   BIRAD 2; NEG   History of Papanicolaou smear of cervix 12/25/2013   -/-   Menorrhagia 10/24/2012   Positive colorectal cancer screening using Cologuard test 26/9485   Umbilical hernia 46/27/0350   REDUCIBLE    Past Surgical History:  Procedure Laterality Date   CERVICAL BIOPSY  W/ LOOP ELECTRODE EXCISION  04/04/2009   [place], 1996   COLONOSCOPY  2010   COLPOSCOPY  2010   CIN2   LEEP  0938   UMBILICAL HERNIA REPAIR N/A 06/20/2017   Procedure: HERNIA REPAIR UMBILICAL ADULT;  Surgeon: Olean Ree, MD;  Location: [facility] ORS;  Service: General;  Laterality: N/A;    Family History  Adopted: Yes  Problem Relation Age of Onset   Breast cancer Neg Hx     Social History   Socioeconomic History   Marital status: Single    Spouse name: Not on file   Number of children: Not on file   Years of education: Not on file   Highest education level: Not on file  Occupational History   Not on file  Tobacco Use  Smoking status: Never   Smokeless tobacco: Never  Vaping Use   Vaping Use: Never used  Substance and Sexual Activity   Alcohol use: No   Drug use: No   Sexual activity: Not Currently    Birth control/protection: Pill  Other Topics Concern   Not on file  Social History Narrative   Not on file   Social Determinants of Health   Financial Resource Strain: Not on file  Food Insecurity: Not on file  Transportation Needs: Not on file  Physical Activity: Not on file  Stress: Not on file  Social Connections: Not on file  Intimate Partner Violence: Not on file    Current Outpatient Medications on File Prior to Visit  Medication Sig Dispense Refill    Multiple Vitamin (MULTIVITAMIN) tablet Take 1 tablet by mouth daily. 28 tablet 4   Probiotic Product (PROBIOTIC-10) CAPS Take by mouth.     No current facility-administered medications on file prior to visit.    ROS:  Review of Systems  Constitutional:  Negative for fatigue, fever and unexpected weight change.  Respiratory:  Negative for cough, shortness of breath and wheezing.   Cardiovascular:  Negative for chest pain, palpitations and leg swelling.  Gastrointestinal:  Negative for blood in stool, constipation, diarrhea, nausea and vomiting.  Endocrine: Negative for cold intolerance, heat intolerance and polyuria.  Genitourinary:  Positive for menstrual problem. Negative for dyspareunia, dysuria, flank pain, frequency, genital sores, hematuria, pelvic pain, urgency, vaginal bleeding, vaginal discharge and vaginal pain.  Musculoskeletal:  Negative for back pain, joint swelling and myalgias.  Skin:  Negative for rash.  Neurological:  Negative for dizziness, syncope, light-headedness, numbness and headaches.  Hematological:  Negative for adenopathy.  Psychiatric/Behavioral:  Negative for agitation, confusion, sleep disturbance and suicidal ideas. The patient is not nervous/anxious.     Objective: BP 100/60   Ht 5\' 3"  (1.6 m)   Wt 165 lb (74.8 kg)   LMP  (LMP Unknown)   BMI 29.23 kg/m    Physical Exam Constitutional:      Appearance: She is well-developed.  Genitourinary:     Vulva normal.     Right Labia: No rash, tenderness or lesions.    Left Labia: No tenderness, lesions or rash.    No vaginal discharge, erythema or tenderness.      Right Adnexa: not tender and no mass present.    Left Adnexa: not tender and no mass present.    No cervical friability or polyp.     Uterus is enlarged.     Uterus is not tender.     Uterine mass present. Breasts:    Right: No mass, nipple discharge, skin change or tenderness.     Left: No mass, nipple discharge, skin change or  tenderness.  Neck:     Thyroid: No thyromegaly.  Cardiovascular:     Rate and Rhythm: Normal rate and regular rhythm.     Heart sounds: Normal heart sounds. No murmur heard. Pulmonary:     Effort: Pulmonary effort is normal.     Breath sounds: Normal breath sounds.  Abdominal:     Palpations: Abdomen is soft.     Tenderness: There is no abdominal tenderness. There is no guarding or rebound.    Musculoskeletal:        General: Normal range of motion.     Cervical back: Normal range of motion.  Lymphadenopathy:     Cervical: No cervical adenopathy.  Neurological:     General: No focal  deficit present.     Mental Status: She is alert and oriented to person, place, and time.     Cranial Nerves: No cranial nerve deficit.  Skin:    General: Skin is warm and dry.  Psychiatric:        Mood and Affect: Mood normal.        Behavior: Behavior normal.        Thought Content: Thought content normal.        Judgment: Judgment normal.  Vitals reviewed.    Assessment/Plan: Encounter for annual routine gynecological examination  Encounter for screening mammogram for malignant neoplasm of breast - Plan: MM 3D SCREEN BREAST BILATERAL; pt has mammo appt  Menorrhagia with regular cycle - Plan: Ambulatory referral to Vascular Surgery; Sx not controlled with cont dosing OCPs. Hx of large leio. Refer to [facility] for [place] consult. Will change to aygestin in meantime. Rx eRxd. Pt to f/u in a month re: bleeding.   Intramural leiomyoma of uterus - Plan:  Ambulatory referral to Vascular Surgery; Discussed lupron with hyst again but also [place]. Pt would like ref to [facility]  Positive colorectal cancer screening using Cologuard test--last wk; refer to GI already placed.   Screening cholesterol level - Plan: Lipid panel  Elevated lipids - Plan: Lipid panel  Meds ordered this encounter  Medications   norethindrone (AYGESTIN) 5 MG tablet    Sig: Take 1 tablet (5 mg total) by mouth  daily. Take 1 tab daily    Dispense:  90 tablet    Refill:  0    Order Specific Question:   Supervising Provider    Answer:   Gae Dry [[id number]]              GYN counsel breast self exam, mammography screening, menopause, adequate intake of calcium and vitamin D, diet and exercise     F/U  Return in about 1 year (around 03/17/2022)./ 1 yr annual   [place] B. Cheyane Ayon, PA-C 03/17/2021 2:47 PM

## 2021-03-16 NOTE — Telephone Encounter (Signed)
Pt aware of positive cologuard result. Refer to GI sent. Has annual appt tomorrow.

## 2021-03-17 ENCOUNTER — Other Ambulatory Visit: Payer: Self-pay

## 2021-03-17 ENCOUNTER — Encounter: Payer: Self-pay | Admitting: Obstetrics and Gynecology

## 2021-03-17 ENCOUNTER — Ambulatory Visit (INDEPENDENT_AMBULATORY_CARE_PROVIDER_SITE_OTHER): Payer: BC Managed Care – PPO | Admitting: Obstetrics and Gynecology

## 2021-03-17 VITALS — BP 100/60 | Ht 63.0 in | Wt 165.0 lb

## 2021-03-17 DIAGNOSIS — R195 Other fecal abnormalities: Secondary | ICD-10-CM

## 2021-03-17 DIAGNOSIS — N920 Excessive and frequent menstruation with regular cycle: Secondary | ICD-10-CM

## 2021-03-17 DIAGNOSIS — D251 Intramural leiomyoma of uterus: Secondary | ICD-10-CM | POA: Diagnosis not present

## 2021-03-17 DIAGNOSIS — Z3041 Encounter for surveillance of contraceptive pills: Secondary | ICD-10-CM

## 2021-03-17 DIAGNOSIS — E785 Hyperlipidemia, unspecified: Secondary | ICD-10-CM | POA: Diagnosis not present

## 2021-03-17 DIAGNOSIS — Z01419 Encounter for gynecological examination (general) (routine) without abnormal findings: Secondary | ICD-10-CM

## 2021-03-17 DIAGNOSIS — Z13220 Encounter for screening for lipoid disorders: Secondary | ICD-10-CM

## 2021-03-17 DIAGNOSIS — Z1231 Encounter for screening mammogram for malignant neoplasm of breast: Secondary | ICD-10-CM

## 2021-03-17 MED ORDER — NORETHINDRONE ACETATE 5 MG PO TABS: 5.0000 mg | ORAL_TABLET | Freq: Every day | ORAL | 0 refills | Status: DC

## 2021-03-19 ENCOUNTER — Encounter: Payer: Self-pay | Admitting: *Deleted

## 2021-03-23 ENCOUNTER — Ambulatory Visit: Payer: BC Managed Care – PPO

## 2021-03-24 ENCOUNTER — Other Ambulatory Visit: Payer: BC Managed Care – PPO

## 2021-03-24 ENCOUNTER — Other Ambulatory Visit: Payer: Self-pay

## 2021-03-24 DIAGNOSIS — E785 Hyperlipidemia, unspecified: Secondary | ICD-10-CM

## 2021-03-24 DIAGNOSIS — Z13220 Encounter for screening for lipoid disorders: Secondary | ICD-10-CM

## 2021-03-25 LAB — LIPID PANEL
Chol/HDL Ratio: 4.3 ratio (ref 0.0–4.4)
Cholesterol, Total: 218 mg/dL — ABNORMAL HIGH (ref 100–199)
HDL: 51 mg/dL (ref >39)
LDL Chol Calc (NIH): 133 mg/dL — ABNORMAL HIGH (ref 0–99)
Triglycerides: 191 mg/dL — ABNORMAL HIGH (ref 0–149)
VLDL Cholesterol Cal: 34 mg/dL (ref 5–40)

## 2021-03-31 ENCOUNTER — Encounter: Payer: Self-pay | Admitting: *Deleted

## 2021-04-02 ENCOUNTER — Other Ambulatory Visit: Payer: Self-pay

## 2021-04-02 ENCOUNTER — Ambulatory Visit
Admission: RE | Admit: 2021-04-02 | Discharge: 2021-04-02 | Disposition: A | Discharge status: Home / Self Care | Payer: BC Managed Care – PPO | Source: Ambulatory Visit | Attending: Obstetrics and Gynecology | Admitting: Obstetrics and Gynecology

## 2021-04-02 DIAGNOSIS — Z1231 Encounter for screening mammogram for malignant neoplasm of breast: Secondary | ICD-10-CM | POA: Diagnosis present

## 2021-04-06 ENCOUNTER — Telehealth (INDEPENDENT_AMBULATORY_CARE_PROVIDER_SITE_OTHER): Payer: Self-pay | Admitting: Gastroenterology

## 2021-04-06 DIAGNOSIS — R195 Other fecal abnormalities: Secondary | ICD-10-CM

## 2021-04-06 DIAGNOSIS — Z1211 Encounter for screening for malignant neoplasm of colon: Secondary | ICD-10-CM

## 2021-04-06 MED ORDER — PEG 3350-KCL-NA BICARB-NACL 420 G PO SOLR: 4000.0000 mL | Freq: Once | ORAL | 0 refills | Status: AC

## 2021-04-06 NOTE — Progress Notes (Signed)
Gastroenterology Pre-Procedure Review  Request Date: 05/05/2021 Requesting Physician: Dr. Allen Norris  PATIENT REVIEW QUESTIONS: The patient responded to the following health history questions as indicated:    1. Are you having any GI issues? no 2. Do you have a personal history of Polyps?  Positive colorectal screening  3. Do you have a family history of Colon Cancer or Polyps?  unsure 4. Diabetes Mellitus? no 5. Joint replacements in the past 12 months?no 6. Major health problems in the past 3 months?no 7. Any artificial heart valves, MVP, or defibrillator?no    MEDICATIONS & ALLERGIES:    Patient reports the following regarding taking any anticoagulation/antiplatelet therapy:   Plavix, Coumadin, Eliquis, Xarelto, Lovenox, Pradaxa, Brilinta, or Effient? no Aspirin? no  Patient confirms/reports the following medications:  Current Outpatient Medications  Medication Sig Dispense Refill   Multiple Vitamin (MULTIVITAMIN) tablet Take 1 tablet by mouth daily. 28 tablet 4   norethindrone (AYGESTIN) 5 MG tablet Take 1 tablet (5 mg total) by mouth daily. Take 1 tab daily 90 tablet 0   polyethylene glycol-electrolytes (NULYTELY) 420 g solution Take 4,000 mLs by mouth once for 1 dose. 4000 mL 0   Probiotic Product (PROBIOTIC-10) CAPS Take by mouth.     No current facility-administered medications for this visit.    Patient confirms/reports the following allergies:  No Known Allergies  Orders Placed This Encounter  Procedures   Procedural/ Surgical Case Request: COLONOSCOPY WITH PROPOFOL    Standing Status:   Standing    Number of Occurrences:   1    Order Specific Question:   Pre-op diagnosis    Answer:   screening for colon cancer z12.11; Positive colorectal cancer screening using Cologuard test    Order Specific Question:   CPT Code    Answer:   [phone]    AUTHORIZATION INFORMATION Primary Insurance: 1D#: Group #:  Secondary Insurance: 1D#: Group #:  SCHEDULE INFORMATION: Date:  05/05/2021 Time: Location: [facility]

## 2021-05-05 ENCOUNTER — Encounter
Admission: RE | Disposition: A | Discharge status: Home / Self Care | Payer: Self-pay | Source: Home / Self Care | Attending: Gastroenterology

## 2021-05-05 ENCOUNTER — Ambulatory Visit: Payer: BC Managed Care – PPO | Admitting: Certified Registered"

## 2021-05-05 ENCOUNTER — Ambulatory Visit
Admission: RE | Admit: 2021-05-05 | Discharge: 2021-05-05 | Disposition: A | Discharge status: Home / Self Care | Payer: BC Managed Care – PPO | Attending: Gastroenterology | Admitting: Gastroenterology

## 2021-05-05 ENCOUNTER — Encounter: Payer: Self-pay | Admitting: Gastroenterology

## 2021-05-05 DIAGNOSIS — R195 Other fecal abnormalities: Secondary | ICD-10-CM

## 2021-05-05 DIAGNOSIS — K640 First degree hemorrhoids: Secondary | ICD-10-CM | POA: Insufficient documentation

## 2021-05-05 DIAGNOSIS — Z793 Long term (current) use of hormonal contraceptives: Secondary | ICD-10-CM | POA: Diagnosis not present

## 2021-05-05 DIAGNOSIS — Z79899 Other long term (current) drug therapy: Secondary | ICD-10-CM | POA: Insufficient documentation

## 2021-05-05 DIAGNOSIS — Z1211 Encounter for screening for malignant neoplasm of colon: Secondary | ICD-10-CM

## 2021-05-05 HISTORY — PX: COLONOSCOPY WITH PROPOFOL: SHX5780

## 2021-05-05 LAB — POCT PREGNANCY, URINE: Preg Test, Ur: NEGATIVE

## 2021-05-05 SURGERY — COLONOSCOPY WITH PROPOFOL
Anesthesia: General

## 2021-05-05 MED ORDER — MIDAZOLAM HCL 5 MG/5ML IJ SOLN
INTRAMUSCULAR | Status: DC | PRN
  Administered 2021-05-05: 2 mg via INTRAVENOUS

## 2021-05-05 MED ORDER — MIDAZOLAM HCL 2 MG/2ML IJ SOLN
INTRAMUSCULAR | Status: AC
  Filled 2021-05-05: qty 2

## 2021-05-05 MED ORDER — PROPOFOL 500 MG/50ML IV EMUL
INTRAVENOUS | Status: DC | PRN
  Administered 2021-05-05: 120 ug/kg/min via INTRAVENOUS

## 2021-05-05 MED ORDER — PROPOFOL 10 MG/ML IV BOLUS
INTRAVENOUS | Status: DC | PRN
Start: 2021-05-05 — End: 2021-05-05
  Administered 2021-05-05: 20 mg via INTRAVENOUS
  Administered 2021-05-05: 100 mg via INTRAVENOUS

## 2021-05-05 MED ORDER — LIDOCAINE HCL (PF) 2 % IJ SOLN
INTRAMUSCULAR | Status: AC
  Filled 2021-05-05: qty 5

## 2021-05-05 MED ORDER — LIDOCAINE 2% (20 MG/ML) 5 ML SYRINGE
INTRAMUSCULAR | Status: DC | PRN
  Administered 2021-05-05: 20 mg via INTRAVENOUS

## 2021-05-05 MED ORDER — SODIUM CHLORIDE 0.9 % IV SOLN: INTRAVENOUS | Status: DC

## 2021-05-05 MED ORDER — PROPOFOL 500 MG/50ML IV EMUL
INTRAVENOUS | Status: AC
  Filled 2021-05-05: qty 50

## 2021-05-05 NOTE — Anesthesia Preprocedure Evaluation (Signed)
Anesthesia Evaluation  Patient identified by MRN, date of birth, ID band Patient awake    Reviewed: Allergy & Precautions, NPO status , Patient's Chart, lab work & pertinent test results  History of Anesthesia Complications Negative for: history of anesthetic complications  Airway Mallampati: III  TM Distance: <3 FB Neck ROM: full    Dental  (+) Chipped   Pulmonary neg pulmonary ROS, neg shortness of breath,    Pulmonary exam normal        Cardiovascular Exercise Tolerance: Good (-) anginanegative cardio ROS Normal cardiovascular exam     Neuro/Psych PSYCHIATRIC DISORDERS negative neurological ROS  negative psych ROS   GI/Hepatic negative GI ROS, Neg liver ROS, neg GERD  ,  Endo/Other  negative endocrine ROS  Renal/GU negative Renal ROS  negative genitourinary   Musculoskeletal   Abdominal   Peds  Hematology negative hematology ROS (+)   Anesthesia Other Findings Past Medical History: 10/20/2011: Anemia     Comment:  d/t chronic blood loss No date: Anxiety 09/18/2008: ASCUS with positive high risk HPV cervical     Comment:  ascus; hpv + 2014: Fibroid     Comment:  c menorrhagia 02/24/2015; 03/12/2016: History of mammography, diagnostic     Comment:  BIRAD 2; NEG 12/25/2013: History of Papanicolaou smear of cervix     Comment:  -/- 10/24/2012: Menorrhagia 02/2021: Positive colorectal cancer screening using Cologuard test 0000000: Umbilical hernia     Comment:  REDUCIBLE  Past Surgical History: 04/04/2009: CERVICAL BIOPSY  W/ LOOP ELECTRODE EXCISION 1988, 1996: CESAREAN SECTION 2010: COLONOSCOPY 2010: COLPOSCOPY     Comment:  CIN2 2010: LEEP 0000000: UMBILICAL HERNIA REPAIR; N/A     Comment:  Procedure: HERNIA REPAIR UMBILICAL ADULT;  Surgeon:               Olean Ree, MD;  Location: [facility] ORS;  Service:               General;  Laterality: N/A;     Reproductive/Obstetrics negative OB  ROS                             Anesthesia Physical Anesthesia Plan  ASA: 3  Anesthesia Plan: General   Post-op Pain Management:    Induction: Intravenous  PONV Risk Score and Plan: Propofol infusion and TIVA  Airway Management Planned: Natural Airway and Nasal Cannula  Additional Equipment:   Intra-op Plan:   Post-operative Plan:   Informed Consent: I have reviewed the patients History and Physical, chart, labs and discussed the procedure including the risks, benefits and alternatives for the proposed anesthesia with the patient or authorized representative who has indicated his/her understanding and acceptance.     Dental Advisory Given  Plan Discussed with: Anesthesiologist, CRNA and Surgeon  Anesthesia Plan Comments: (Patient consented for risks of anesthesia including but not limited to:  - adverse reactions to medications - risk of airway placement if required - damage to eyes, teeth, lips or other oral mucosa - nerve damage due to positioning  - sore throat or hoarseness - Damage to heart, brain, nerves, lungs, other parts of body or loss of life  Patient voiced understanding.)        Anesthesia Quick Evaluation

## 2021-05-05 NOTE — H&P (Signed)
Jonathon Bellows, MD [address], [place], [place], [place], [address] [address], [place], [place], [place], [address] Phone: [phone]  Fax: [phone]  Primary Care Physician:  Chad Cordial, PA-C   Pre-Procedure History & Physical: HPI:  Andrea Lewis is a 55 y.o. female is here for an colonoscopy.   Past Medical History:  Diagnosis Date   Anemia 10/20/2011   d/t chronic blood loss   Anxiety    ASCUS with positive high risk HPV cervical 09/18/2008   ascus; hpv +   Fibroid 2014   c menorrhagia   History of mammography, diagnostic 02/24/2015; 03/12/2016   BIRAD 2; NEG   History of Papanicolaou smear of cervix 12/25/2013   -/-   Menorrhagia 10/24/2012   Positive colorectal cancer screening using Cologuard test [id number]   Umbilical hernia 0000000   REDUCIBLE    Past Surgical History:  Procedure Laterality Date   CERVICAL BIOPSY  W/ LOOP ELECTRODE EXCISION  04/04/2009   [place], 1996   COLONOSCOPY  2010   COLPOSCOPY  2010   CIN2   LEEP  [id number]   UMBILICAL HERNIA REPAIR N/A 06/20/2017   Procedure: HERNIA REPAIR UMBILICAL ADULT;  Surgeon: Olean Ree, MD;  Location: [facility] ORS;  Service: General;  Laterality: N/A;    Prior to Admission medications   Medication Sig Start Date End Date Taking? Authorizing Provider  fexofenadine (ALLEGRA) 60 MG tablet Take 60 mg by mouth 2 (two) times daily.   Yes [provider]  norethindrone (AYGESTIN) 5 MG tablet Take 1 tablet (5 mg total) by mouth daily. Take 1 tab daily 0000000  Yes Copland, Alicia B, PA-C  Multiple Vitamin (MULTIVITAMIN) tablet Take 1 tablet by mouth daily. [id number]   Copland, Deirdre Evener, PA-C  Probiotic Product (PROBIOTIC-10) CAPS Take by mouth.    [provider]    Allergies as of 04/06/2021   (No Known Allergies)    Family History  Adopted: Yes  Problem Relation Age of Onset   Breast cancer Neg Hx     Social History   Socioeconomic History   Marital  status: Single    Spouse name: Not on file   Number of children: Not on file   Years of education: Not on file   Highest education level: Not on file  Occupational History   Not on file  Tobacco Use   Smoking status: Never   Smokeless tobacco: Never  Vaping Use   Vaping Use: Never used  Substance and Sexual Activity   Alcohol use: No   Drug use: No   Sexual activity: Not Currently    Birth control/protection: Pill  Other Topics Concern   Not on file  Social History Narrative   Not on file   Social Determinants of Health   Financial Resource Strain: Not on file  Food Insecurity: Not on file  Transportation Needs: Not on file  Physical Activity: Not on file  Stress: Not on file  Social Connections: Not on file  Intimate Partner Violence: Not on file    Review of Systems: See HPI, otherwise negative ROS  Physical Exam: BP 124/76   Pulse 77   Temp (!) 96.2 F (35.7 C) (Temporal)   Resp 16   Ht '5\' 3"'$  (1.6 m)   Wt 72.1 kg   SpO2 100%   BMI 28.17 kg/m  General:   Alert,  pleasant and cooperative in NAD Head:  Normocephalic and atraumatic. Neck:  Supple; no  masses or thyromegaly. Lungs:  Clear throughout to auscultation, normal respiratory effort.    Heart:  +S1, +S2, Regular rate and rhythm, No edema. Abdomen:  Soft, nontender and nondistended. Normal bowel sounds, without guarding, and without rebound.   Neurologic:  Alert and  oriented x4;  grossly normal neurologically.  Impression/Plan: Andrea Lewis is here for an colonoscopy to be performed for Screening colonoscopy for positive cologaurd test Risks, benefits, limitations, and alternatives regarding  colonoscopy have been reviewed with the patient.  Questions have been answered.  All parties agreeable.   Jonathon Bellows, MD  05/05/2021, 9:48 AM

## 2021-05-05 NOTE — Anesthesia Postprocedure Evaluation (Signed)
Anesthesia Post Note  Patient: Andrea Lewis  Procedure(s) Performed: COLONOSCOPY WITH PROPOFOL  Patient location during evaluation: Endoscopy Anesthesia Type: General Level of consciousness: awake and alert Pain management: pain level controlled Vital Signs Assessment: post-procedure vital signs reviewed and stable Respiratory status: spontaneous breathing, nonlabored ventilation, respiratory function stable and patient connected to nasal cannula oxygen Cardiovascular status: blood pressure returned to baseline and stable Postop Assessment: no apparent nausea or vomiting Anesthetic complications: no   No notable events documented.   Last Vitals:  Vitals:   05/05/21 1041 05/05/21 1051  BP: (!) 114/94 119/71  Pulse: 72 66  Resp: 14 17  Temp:    SpO2: 100% 100%    Last Pain:  Vitals:   05/05/21 1051  TempSrc:   PainSc: 0-No pain                 Precious Haws Mackenzi Krogh

## 2021-05-05 NOTE — Transfer of Care (Signed)
Immediate Anesthesia Transfer of Care Note  Patient: Andrea Lewis  Procedure(s) Performed: COLONOSCOPY WITH PROPOFOL  Patient Location: Endoscopy Unit  Anesthesia Type:General  Level of Consciousness: drowsy  Airway & Oxygen Therapy: Patient Spontanous Breathing  Post-op Assessment: Report given to RN and Post -op Vital signs reviewed and stable  Post vital signs: Reviewed  Last Vitals:  Vitals Value Taken Time  BP 105/58 05/05/21 1021  Temp    Pulse 77 05/05/21 1021  Resp 18 05/05/21 1021  SpO2 100 % 05/05/21 1021  Vitals shown include unvalidated device data.  Last Pain:  Vitals:   05/05/21 0933  TempSrc: Temporal  PainSc: 0-No pain         Complications: No notable events documented.

## 2021-05-05 NOTE — Op Note (Signed)
[facility] Gastroenterology Patient Name: Andrea Lewis Procedure Date: 05/05/2021 9:58 AM MRN: [id number] Account #: [id number] Date of Birth: 28-Oct-1965 Admit Type: Outpatient Age: 55 Room: [facility] ENDO ROOM 2 Gender: Female Note Status: Finalized Instrument Name: Jasper Riling [id number] Procedure:             Colonoscopy Indications:           Positive Cologuard test Providers:             Jonathon Bellows MD, MD Referring MD:          No Local Md, MD (Referring MD) Medicines:             Monitored Anesthesia Care Complications:         No immediate complications. Procedure:             Pre-Anesthesia Assessment:                        - Prior to the procedure, a History and Physical was                         performed, and patient medications, allergies and                         sensitivities were reviewed. The patient's tolerance                         of previous anesthesia was reviewed.                        - The risks and benefits of the procedure and the                         sedation options and risks were discussed with the                         patient. All questions were answered and informed                         consent was obtained.                        - ASA Grade Assessment: II - A patient with mild                         systemic disease.                        After obtaining informed consent, the colonoscope was                         passed under direct vision. Throughout the procedure,                         the patient's blood pressure, pulse, and oxygen                         saturations were monitored continuously. The                         Colonoscope was introduced through  the anus and                         advanced to the the cecum, identified by the                         appendiceal orifice. The colonoscopy was performed                         with ease. The patient tolerated the procedure well.                          The quality of the bowel preparation was good. Findings:      The perianal and digital rectal examinations were normal.      Non-bleeding internal hemorrhoids were found during retroflexion. The       hemorrhoids were large and Grade I (internal hemorrhoids that do not       prolapse).      The exam was otherwise without abnormality on direct and retroflexion       views. Impression:            - Non-bleeding internal hemorrhoids.                        - The examination was otherwise normal on direct and                         retroflexion views.                        - No specimens collected. Recommendation:        - Discharge patient to home (with escort).                        - Resume previous diet.                        - Continue present medications. Procedure Code(s):     --- Professional ---                        [phone], Colonoscopy, flexible; diagnostic, including                         collection of specimen(s) by brushing or washing, when                         performed (separate procedure) Diagnosis Code(s):     --- Professional ---                        K64.0, First degree hemorrhoids                        R19.5, Other fecal abnormalities CPT copyright 2019 American Medical Association. All rights reserved. The codes documented in this report are preliminary and upon coder review may  be revised to meet current compliance requirements. Jonathon Bellows, MD Jonathon Bellows MD, MD 05/05/2021 10:18:57 AM This report has been signed electronically. Number of Addenda: 0 Note Initiated On: 05/05/2021 9:58 AM Scope Withdrawal Time: 0 hours 8 minutes 36 seconds  Total Procedure  Duration: 0 hours 13 minutes 22 seconds  Estimated Blood Loss:  Estimated blood loss: none.      [facility]

## 2021-05-06 ENCOUNTER — Encounter: Payer: Self-pay | Admitting: Gastroenterology

## 2021-06-22 ENCOUNTER — Telehealth: Payer: Self-pay

## 2021-06-22 NOTE — Telephone Encounter (Signed)
Pt calling; needs refill of rx - doesn't remember name of it but is running low; [facility] [place].  [phone]

## 2021-06-23 ENCOUNTER — Other Ambulatory Visit: Payer: Self-pay | Admitting: Obstetrics and Gynecology

## 2021-06-23 DIAGNOSIS — N920 Excessive and frequent menstruation with regular cycle: Secondary | ICD-10-CM

## 2021-06-23 MED ORDER — NORETHINDRONE ACETATE 5 MG PO TABS: 5.0000 mg | ORAL_TABLET | Freq: Every day | ORAL | 2 refills | Status: DC

## 2021-06-23 NOTE — Telephone Encounter (Signed)
Please advise medication follow up appointment?>

## 2021-06-23 NOTE — Telephone Encounter (Signed)
Spoke with pt. Rx RF aygestin, doing well. Didn't do [facility] ref

## 2021-06-23 NOTE — Progress Notes (Signed)
Rx RF aygestin 5 mg for menorrhagia due to leio, not controlled with OCPs. Started 7/22, sx resolved. Much better, no bleeding/bloating/pressure. Wants to continue. Never did [place] consult with [facility] and declines for now. F/u prn.

## 2021-06-23 NOTE — Telephone Encounter (Signed)
F/u appt not needed. UTR pt--no VM set up. Need to know if aygestin stopped bleeding. Also, did pt do [facility] appt for leio?

## 2022-03-05 ENCOUNTER — Other Ambulatory Visit: Payer: Self-pay | Admitting: Obstetrics and Gynecology

## 2022-03-05 DIAGNOSIS — Z1231 Encounter for screening mammogram for malignant neoplasm of breast: Secondary | ICD-10-CM

## 2022-03-15 ENCOUNTER — Telehealth: Payer: Self-pay

## 2022-03-15 NOTE — Telephone Encounter (Signed)
Kim called triage line stating she needed a medication refill but never stated which medication she needed to refill.

## 2022-03-15 NOTE — Telephone Encounter (Signed)
Called Kim back and voicemail box has not been set up.

## 2022-03-18 ENCOUNTER — Other Ambulatory Visit: Payer: Self-pay

## 2022-03-18 DIAGNOSIS — N920 Excessive and frequent menstruation with regular cycle: Secondary | ICD-10-CM

## 2022-03-18 MED ORDER — NORETHINDRONE ACETATE 5 MG PO TABS: 5.0000 mg | ORAL_TABLET | Freq: Every day | ORAL | 0 refills | Status: DC

## 2022-03-18 NOTE — Telephone Encounter (Signed)
Refill sent.

## 2022-03-18 NOTE — Telephone Encounter (Signed)
Pt is calling back about a refill for norethindrone (AYGESTIN) 5 MG tablet.  She called on 7/17 and KS returned the call.  Pt was not available.  She has an appt with [facility] on 8/24.

## 2022-04-06 ENCOUNTER — Ambulatory Visit
Admission: RE | Admit: 2022-04-06 | Discharge: 2022-04-06 | Disposition: A | Discharge status: Home / Self Care | Payer: BC Managed Care – PPO | Source: Ambulatory Visit | Attending: Obstetrics and Gynecology | Admitting: Obstetrics and Gynecology

## 2022-04-06 DIAGNOSIS — Z1231 Encounter for screening mammogram for malignant neoplasm of breast: Secondary | ICD-10-CM | POA: Diagnosis present

## 2022-04-21 NOTE — Progress Notes (Unsigned)
No chief complaint on file.    HPI:      Ms. Tarryn Bogdan is a 56 y.o. [id number] who LMP was No LMP recorded. (Menstrual status: Irregular Periods)., presents today for her annual examination.  Her menses are almost daily light spotting with occas gushes with clots; on continuous dosing of OCPs for cycle control due to large leio.  Dysmenorrhea in back with heavier flow.  She has a very large uterus due to "Within the uterus are multiple suspected fibroids. The largest fibroid seen was 9.0 cm. There were numerous 8 to 8.5 cm fibroids. Numerous 6 to 6.5 cm fibroids and downwards." (7/21 GYN u/s). Leio larger than previous u/s 2018. Normal CBC 7/21. 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 She saw Dr. Kenton Kingfisher in 2014 who recommended lupron to shrink the fibroids before hyst, but that was too expensive and pt not interested. Discussed ref for [place] last yr but pt wasn't interested. Her bleeding is improved but not controlled with OCPs. There is concern about the size of her uterus. Pt denies urinary and fecal incont, no pelvic pain. Pt also had an umbilical hernia that complicated the situation and this was surgically repaired emergently 10/18.   On aygeting   Sex activity: not sexually active.  Last Pap: 03/04/20 Results were: no abnormalities /neg HPV DNA . 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 Has appt 8/22 FH unknown due to adoption. The patient does not do self-breast exams.   Tobacco use: The patient denies current or previous tobacco use. Alcohol use: none No drug use. Exercise: moderately active   She does get adequate calcium and Vitamin D in her diet.   She had borderline LDL and slightly elevated TGs  6/19, worse 7/21. Repeat Labs due.  Colonoscopy: positive cologuard last wk, referral to GI already placed. Pt awaiting appt info.   Past  Medical History:  Diagnosis Date   Anemia 10/20/2011   d/t chronic blood loss   Anxiety    ASCUS with positive high risk HPV cervical 09/18/2008   ascus; hpv +   Fibroid 2014   c menorrhagia   History of mammography, diagnostic 02/24/2015; 03/12/2016   BIRAD 2; NEG   History of Papanicolaou smear of cervix 12/25/2013   -/-   Menorrhagia 10/24/2012   Positive colorectal cancer screening using Cologuard test 01/3015   Umbilical hernia 09/07/3233   REDUCIBLE    Past Surgical History:  Procedure Laterality Date   CERVICAL BIOPSY  W/ LOOP ELECTRODE EXCISION  04/04/2009   [place], 1996   COLONOSCOPY  2010   COLONOSCOPY WITH PROPOFOL N/A 05/05/2021   Procedure: COLONOSCOPY WITH PROPOFOL;  Surgeon: Jonathon Bellows, MD;  Location: [facility]COPY;  Service: Endoscopy;  Laterality: N/A;   COLPOSCOPY  2010   CIN2   LEEP  5732   UMBILICAL HERNIA REPAIR N/A 06/20/2017   Procedure: HERNIA REPAIR UMBILICAL ADULT;  Surgeon: Olean Ree, MD;  Location: [facility] ORS;  Service: General;  Laterality: N/A;    Family History  Adopted: Yes  Problem Relation Age of Onset   Breast cancer Neg Hx     Social History   Socioeconomic History   Marital status: Single    Spouse name: Not on file   Number of children: Not on file   Years of education: Not on file  Highest education level: Not on file  Occupational History   Not on file  Tobacco Use   Smoking status: Never   Smokeless tobacco: Never  Vaping Use   Vaping Use: Never used  Substance and Sexual Activity   Alcohol use: No   Drug use: No   Sexual activity: Not Currently    Birth control/protection: Pill  Other Topics Concern   Not on file  Social History Narrative   Not on file   Social Determinants of Health   Financial Resource Strain: Not on file  Food Insecurity: Not on file  Transportation Needs: Not on file  Physical Activity: Not on file  Stress: Not on file  Social Connections: Not on file  Intimate  Partner Violence: Not on file    Current Outpatient Medications on File Prior to Visit  Medication Sig Dispense Refill   fexofenadine (ALLEGRA) 60 MG tablet Take 60 mg by mouth 2 (two) times daily.     Multiple Vitamin (MULTIVITAMIN) tablet Take 1 tablet by mouth daily. 28 tablet 4   norethindrone (AYGESTIN) 5 MG tablet Take 1 tablet (5 mg total) by mouth daily. Take 1 tab daily 90 tablet 0   Probiotic Product (PROBIOTIC-10) CAPS Take by mouth.     No current facility-administered medications on file prior to visit.    ROS:  Review of Systems  Constitutional:  Negative for fatigue, fever and unexpected weight change.  Respiratory:  Negative for cough, shortness of breath and wheezing.   Cardiovascular:  Negative for chest pain, palpitations and leg swelling.  Gastrointestinal:  Negative for blood in stool, constipation, diarrhea, nausea and vomiting.  Endocrine: Negative for cold intolerance, heat intolerance and polyuria.  Genitourinary:  Positive for menstrual problem. Negative for dyspareunia, dysuria, flank pain, frequency, genital sores, hematuria, pelvic pain, urgency, vaginal bleeding, vaginal discharge and vaginal pain.  Musculoskeletal:  Negative for back pain, joint swelling and myalgias.  Skin:  Negative for rash.  Neurological:  Negative for dizziness, syncope, light-headedness, numbness and headaches.  Hematological:  Negative for adenopathy.  Psychiatric/Behavioral:  Negative for agitation, confusion, sleep disturbance and suicidal ideas. The patient is not nervous/anxious.      Objective: There were no vitals taken for this visit.   Physical Exam Constitutional:      Appearance: She is well-developed.  Genitourinary:     Vulva normal.     Right Labia: No rash, tenderness or lesions.    Left Labia: No tenderness, lesions or rash.    No vaginal discharge, erythema or tenderness.      Right Adnexa: not tender and no mass present.    Left Adnexa: not tender and  no mass present.    No cervical friability or polyp.     Uterus is enlarged.     Uterus is not tender.     Uterine mass present. Breasts:    Right: No mass, nipple discharge, skin change or tenderness.     Left: No mass, nipple discharge, skin change or tenderness.  Neck:     Thyroid: No thyromegaly.  Cardiovascular:     Rate and Rhythm: Normal rate and regular rhythm.     Heart sounds: Normal heart sounds. No murmur heard. Pulmonary:     Effort: Pulmonary effort is normal.     Breath sounds: Normal breath sounds.  Abdominal:     Palpations: Abdomen is soft.     Tenderness: There is no abdominal tenderness. There is no guarding or rebound.    Musculoskeletal:  General: Normal range of motion.     Cervical back: Normal range of motion.  Lymphadenopathy:     Cervical: No cervical adenopathy.  Neurological:     General: No focal deficit present.     Mental Status: She is alert and oriented to person, place, and time.     Cranial Nerves: No cranial nerve deficit.  Skin:    General: Skin is warm and dry.  Psychiatric:        Mood and Affect: Mood normal.        Behavior: Behavior normal.        Thought Content: Thought content normal.        Judgment: Judgment normal.  Vitals reviewed.     Assessment/Plan: Encounter for annual routine gynecological examination  Encounter for screening mammogram for malignant neoplasm of breast - Plan: MM 3D SCREEN BREAST BILATERAL; pt has mammo appt  Menorrhagia with regular cycle - Plan: Ambulatory referral to Vascular Surgery; Sx not controlled with cont dosing OCPs. Hx of large leio. Refer to [facility] for [place] consult. Will change to aygestin in meantime. Rx eRxd. Pt to f/u in a month re: bleeding.   Intramural leiomyoma of uterus - Plan:  Ambulatory referral to Vascular Surgery; Discussed lupron with hyst again but also [place]. Pt would like ref to [facility]  Positive colorectal cancer screening using Cologuard  test--last wk; refer to GI already placed.   Screening cholesterol level - Plan: Lipid panel  Elevated lipids - Plan: Lipid panel  No orders of the defined types were placed in this encounter.             GYN counsel breast self exam, mammography screening, menopause, adequate intake of calcium and vitamin D, diet and exercise     F/U  No follow-ups on file./ 1 yr annual   [place] B. Driana Dazey, PA-C 04/21/2022 4:12 PM

## 2022-04-22 ENCOUNTER — Other Ambulatory Visit (HOSPITAL_COMMUNITY)
Admission: RE | Admit: 2022-04-22 | Discharge: 2022-04-22 | Disposition: A | Discharge status: Home / Self Care | Payer: BC Managed Care – PPO | Source: Ambulatory Visit | Attending: Obstetrics and Gynecology | Admitting: Obstetrics and Gynecology

## 2022-04-22 ENCOUNTER — Encounter: Payer: Self-pay | Admitting: Obstetrics and Gynecology

## 2022-04-22 ENCOUNTER — Ambulatory Visit (INDEPENDENT_AMBULATORY_CARE_PROVIDER_SITE_OTHER): Payer: BC Managed Care – PPO | Admitting: Obstetrics and Gynecology

## 2022-04-22 VITALS — BP 122/70 | Ht 63.0 in | Wt 169.4 lb

## 2022-04-22 DIAGNOSIS — Z1231 Encounter for screening mammogram for malignant neoplasm of breast: Secondary | ICD-10-CM

## 2022-04-22 DIAGNOSIS — N871 Moderate cervical dysplasia: Secondary | ICD-10-CM | POA: Insufficient documentation

## 2022-04-22 DIAGNOSIS — Z124 Encounter for screening for malignant neoplasm of cervix: Secondary | ICD-10-CM | POA: Diagnosis not present

## 2022-04-22 DIAGNOSIS — Z01419 Encounter for gynecological examination (general) (routine) without abnormal findings: Secondary | ICD-10-CM

## 2022-04-22 DIAGNOSIS — N920 Excessive and frequent menstruation with regular cycle: Secondary | ICD-10-CM

## 2022-04-22 DIAGNOSIS — R195 Other fecal abnormalities: Secondary | ICD-10-CM

## 2022-04-22 DIAGNOSIS — Z1151 Encounter for screening for human papillomavirus (HPV): Secondary | ICD-10-CM | POA: Insufficient documentation

## 2022-04-22 DIAGNOSIS — E785 Hyperlipidemia, unspecified: Secondary | ICD-10-CM

## 2022-04-22 DIAGNOSIS — Z0000 Encounter for general adult medical examination without abnormal findings: Secondary | ICD-10-CM

## 2022-04-22 DIAGNOSIS — Z13220 Encounter for screening for lipoid disorders: Secondary | ICD-10-CM

## 2022-04-22 DIAGNOSIS — D251 Intramural leiomyoma of uterus: Secondary | ICD-10-CM

## 2022-04-22 MED ORDER — NORETHINDRONE ACETATE 5 MG PO TABS: 5.0000 mg | ORAL_TABLET | Freq: Every day | ORAL | 3 refills | Status: DC

## 2022-04-22 NOTE — Patient Instructions (Signed)
I value your feedback and you entrusting us with your care. If you get a [facility] patient survey, I would appreciate you taking the time to let us know about your experience today. Thank you! ? ? ?

## 2022-04-27 ENCOUNTER — Other Ambulatory Visit: Payer: BC Managed Care – PPO

## 2022-04-27 DIAGNOSIS — Z13220 Encounter for screening for lipoid disorders: Secondary | ICD-10-CM

## 2022-04-27 DIAGNOSIS — E785 Hyperlipidemia, unspecified: Secondary | ICD-10-CM

## 2022-04-27 DIAGNOSIS — Z0000 Encounter for general adult medical examination without abnormal findings: Secondary | ICD-10-CM

## 2022-04-27 LAB — CYTOLOGY - PAP
Adequacy: ABSENT
Comment: NEGATIVE
Diagnosis: NEGATIVE
High risk HPV: NEGATIVE

## 2022-04-28 LAB — LIPID PANEL
Chol/HDL Ratio: 6.1 ratio — ABNORMAL HIGH (ref 0.0–4.4)
Cholesterol, Total: 220 mg/dL — ABNORMAL HIGH (ref 100–199)
HDL: 36 mg/dL — ABNORMAL LOW (ref >39)
LDL Chol Calc (NIH): 158 mg/dL — ABNORMAL HIGH (ref 0–99)
Triglycerides: 144 mg/dL (ref 0–149)
VLDL Cholesterol Cal: 26 mg/dL (ref 5–40)

## 2022-04-28 LAB — COMPREHENSIVE METABOLIC PANEL
ALT: 16 IU/L (ref 0–32)
AST: 17 IU/L (ref 0–40)
Albumin/Globulin Ratio: 2 (ref 1.2–2.2)
Albumin: 4.7 g/dL (ref 3.8–4.9)
Alkaline Phosphatase: 77 IU/L (ref 44–121)
BUN/Creatinine Ratio: 18 (ref 9–23)
BUN: 11 mg/dL (ref 6–24)
Bilirubin Total: 0.4 mg/dL (ref 0.0–1.2)
CO2: 23 mmol/L (ref 20–29)
Calcium: 9.3 mg/dL (ref 8.7–10.2)
Chloride: 107 mmol/L — ABNORMAL HIGH (ref 96–106)
Creatinine, Ser: 0.61 mg/dL (ref 0.57–1.00)
Globulin, Total: 2.4 g/dL (ref 1.5–4.5)
Glucose: 90 mg/dL (ref 70–99)
Potassium: 4.6 mmol/L (ref 3.5–5.2)
Sodium: 145 mmol/L — ABNORMAL HIGH (ref 134–144)
Total Protein: 7.1 g/dL (ref 6.0–8.5)
eGFR: 106 mL/min/{1.73_m2} (ref >59)

## 2023-02-24 ENCOUNTER — Other Ambulatory Visit: Payer: Self-pay | Admitting: Obstetrics and Gynecology

## 2023-02-24 ENCOUNTER — Encounter: Payer: Self-pay | Admitting: Obstetrics and Gynecology

## 2023-02-24 DIAGNOSIS — Z1231 Encounter for screening mammogram for malignant neoplasm of breast: Secondary | ICD-10-CM

## 2023-04-08 ENCOUNTER — Ambulatory Visit
Admission: RE | Admit: 2023-04-08 | Discharge: 2023-04-08 | Disposition: A | Discharge status: Home / Self Care | Payer: BC Managed Care – PPO | Source: Ambulatory Visit | Attending: Obstetrics and Gynecology | Admitting: Obstetrics and Gynecology

## 2023-04-08 DIAGNOSIS — Z1231 Encounter for screening mammogram for malignant neoplasm of breast: Secondary | ICD-10-CM

## 2023-04-28 ENCOUNTER — Ambulatory Visit (INDEPENDENT_AMBULATORY_CARE_PROVIDER_SITE_OTHER): Payer: BC Managed Care – PPO | Admitting: Obstetrics and Gynecology

## 2023-04-28 ENCOUNTER — Encounter: Payer: Self-pay | Admitting: Obstetrics and Gynecology

## 2023-04-28 VITALS — BP 104/78 | Ht 63.0 in | Wt 174.0 lb

## 2023-04-28 DIAGNOSIS — E785 Hyperlipidemia, unspecified: Secondary | ICD-10-CM

## 2023-04-28 DIAGNOSIS — Z131 Encounter for screening for diabetes mellitus: Secondary | ICD-10-CM

## 2023-04-28 DIAGNOSIS — Z01419 Encounter for gynecological examination (general) (routine) without abnormal findings: Secondary | ICD-10-CM

## 2023-04-28 DIAGNOSIS — D251 Intramural leiomyoma of uterus: Secondary | ICD-10-CM

## 2023-04-28 DIAGNOSIS — N920 Excessive and frequent menstruation with regular cycle: Secondary | ICD-10-CM

## 2023-04-28 DIAGNOSIS — Z1231 Encounter for screening mammogram for malignant neoplasm of breast: Secondary | ICD-10-CM

## 2023-04-28 DIAGNOSIS — Z13220 Encounter for screening for lipoid disorders: Secondary | ICD-10-CM

## 2023-04-28 DIAGNOSIS — Z0000 Encounter for general adult medical examination without abnormal findings: Secondary | ICD-10-CM

## 2023-04-28 MED ORDER — NORETHINDRONE ACETATE 5 MG PO TABS: 5.0000 mg | ORAL_TABLET | Freq: Every day | ORAL | 2 refills | Status: DC

## 2023-04-28 NOTE — Patient Instructions (Signed)
I value your feedback and you entrusting us with your care. If you get a [facility] patient survey, I would appreciate you taking the time to let us know about your experience today. Thank you! ? ? ?

## 2023-04-28 NOTE — Progress Notes (Signed)
Chief Complaint  Patient presents with   Gynecologic Exam    No concerns     HPI:      Andrea Lewis is a 57 y.o. [id number] who LMP was No LMP recorded. (Menstrual status: Irregular Periods)., presents today for her annual examination.  Her menses are absent now with aygestin, started 2022, but has daily yellow or brown d/c. Was on continuous dosing OCPs with almost daily light spotting with occas gushes with clots.  She has a very large uterus due to "Within the uterus are multiple suspected fibroids. The largest fibroid seen was 9.0 cm. There were numerous 8 to 8.5 cm fibroids. Numerous 6 to 6.5 cm fibroids and downwards." (7/21 GYN u/s). Leio larger than previous u/s 2018. Normal CBC 7/21. 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 She saw Dr. Tiburcio Pea in 2014 who recommended lupron to shrink the fibroids before hyst, but that was too expensive and pt not interested. Discussed ref for [place] last yr but pt didn't do it. There is concern about the size of her uterus. 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 No vasomotor sx. Pt also had an umbilical hernia that complicated the situation and this was surgically repaired emergently 10/18.   Sex activity: not sexually active. No vag sx.  Last Pap: 04/22/22 Results were: no abnormalities /neg HPV DNA . 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  FH unknown due to adoption. The patient does not do self-breast exams.   Tobacco use: The patient denies current or previous tobacco use. Alcohol use: none No drug use. Exercise: mod active   She does get adequate calcium and Vitamin D in her diet.   She had borderline LDL and slightly elevated TGs  6/19, worse 7/21 and 8/23. Repeat Labs due.  Colonoscopy: positive cologuard 2022; colonoscopy  done 9/22 at [facility] with hemorrhoids, repeat due after 10 yrs   Past Medical History:  Diagnosis Date   Anemia 10/20/2011   d/t chronic blood loss   Anxiety    ASCUS with positive high risk HPV cervical 09/18/2008   ascus; hpv +   Fibroid 2014   c menorrhagia   History of mammography, diagnostic 02/24/2015; 03/12/2016   BIRAD 2; NEG   History of Papanicolaou smear of cervix 12/25/2013   -/-   Menorrhagia 10/24/2012   Positive colorectal cancer screening using Cologuard test 02/2021   Umbilical hernia 10/25/2012   REDUCIBLE    Past Surgical History:  Procedure Laterality Date   CERVICAL BIOPSY  W/ LOOP ELECTRODE EXCISION  04/04/2009   CESAREAN SECTION  1988, 1996   COLONOSCOPY  2010   COLONOSCOPY WITH PROPOFOL N/A 05/05/2021   Procedure: COLONOSCOPY WITH PROPOFOL;  Surgeon: Wyline Mood, MD;  Location: [facility] ENDOSCOPY;  Service: Endoscopy;  Laterality: N/A;   COLPOSCOPY  2010   CIN2   LEEP  2010   UMBILICAL HERNIA REPAIR N/A 06/20/2017   Procedure: HERNIA REPAIR UMBILICAL ADULT;  Surgeon: Henrene Dodge, MD;  Location: [facility] ORS;  Service: General;  Laterality: N/A;    Family History  Adopted: Yes  Problem Relation Age of Onset   Breast cancer Neg Hx     Social History   Socioeconomic History   Marital status: Single    Spouse  name: Not on file   Number of children: Not on file   Years of education: Not on file   Highest education level: Not on file  Occupational History   Not on file  Tobacco Use   Smoking status: Never   Smokeless tobacco: Never  Vaping Use   Vaping status: Never Used  Substance and Sexual Activity   Alcohol use: No   Drug use: No   Sexual activity: Not Currently    Birth control/protection: Pill  Other Topics Concern   Not on file  Social History Narrative   Not on file   Social Determinants of Health   Financial Resource Strain: Not on file  Food Insecurity: Not on file  Transportation Needs: Not on file  Physical Activity: Not  on file  Stress: Not on file  Social Connections: Not on file  Intimate Partner Violence: Not on file    Current Outpatient Medications on File Prior to Visit  Medication Sig Dispense Refill   fexofenadine (ALLEGRA) 60 MG tablet Take 60 mg by mouth 2 (two) times daily.     fluticasone (FLONASE) 50 MCG/ACT nasal spray Place 2 sprays into both nostrils daily.     Multiple Vitamin (MULTIVITAMIN) tablet Take 1 tablet by mouth daily. 28 tablet 4   Probiotic Product (PROBIOTIC-10) CAPS Take by mouth.     No current facility-administered medications on file prior to visit.    ROS:  Review of Systems  Constitutional:  Negative for fatigue, fever and unexpected weight change.  Respiratory:  Negative for cough, shortness of breath and wheezing.   Cardiovascular:  Negative for chest pain, palpitations and leg swelling.  Gastrointestinal:  Negative for blood in stool, constipation, diarrhea, nausea and vomiting.  Endocrine: Negative for cold intolerance, heat intolerance and polyuria.  Genitourinary:  Positive for vaginal bleeding. Negative for dyspareunia, dysuria, flank pain, frequency, genital sores, hematuria, menstrual problem, pelvic pain, urgency, vaginal discharge and vaginal pain.  Musculoskeletal:  Negative for back pain, joint swelling and myalgias.  Skin:  Negative for rash.  Neurological:  Negative for dizziness, syncope, light-headedness, numbness and headaches.  Hematological:  Negative for adenopathy.  Psychiatric/Behavioral:  Negative for agitation, confusion, sleep disturbance and suicidal ideas. The patient is not nervous/anxious.      Objective: BP 104/78   Ht 5\' 3"  (1.6 m)   Wt 174 lb (78.9 kg)   BMI 30.82 kg/m    Physical Exam Constitutional:      Appearance: She is well-developed.  Genitourinary:     Vulva normal.     Right Labia: No rash, tenderness or lesions.    Left Labia: No tenderness, lesions or rash.    No vaginal discharge, erythema or tenderness.       Right Adnexa: not tender and no mass present.    Left Adnexa: not tender and no mass present.    No cervical friability or polyp.     Uterus is enlarged.     Uterus is not tender.     Uterine mass present. Breasts:    Right: No mass, nipple discharge, skin change or tenderness.     Left: No mass, nipple discharge, skin change or tenderness.  Neck:     Thyroid: No thyromegaly.  Cardiovascular:     Rate and Rhythm: Normal rate and regular rhythm.     Heart sounds: Normal heart sounds. No murmur heard. Pulmonary:     Effort: Pulmonary effort is normal.     Breath sounds: Normal breath sounds.  Abdominal:     Palpations: Abdomen is soft.     Tenderness: There is no abdominal tenderness. There is no guarding or rebound.    Musculoskeletal:        General: Normal range of motion.     Cervical back: Normal range of motion.  Lymphadenopathy:     Cervical: No cervical adenopathy.  Neurological:     General: No focal deficit present.     Mental Status: She is alert and oriented to person, place, and time.     Cranial Nerves: No cranial nerve deficit.  Skin:    General: Skin is warm and dry.  Psychiatric:        Mood and Affect: Mood normal.        Behavior: Behavior normal.        Thought Content: Thought content normal.        Judgment: Judgment normal.  Vitals reviewed.     Assessment/Plan: Encounter for annual routine gynecological examination  Encounter for screening mammogram for malignant neoplasm of breast; pt current on mammo  Intramural leiomyoma of uterus - Plan: norethindrone (AYGESTIN) 5 MG tablet; pt not interested in tx. Still has daily yellow d/c to light brown d/c. Wants to cont aygestin for now due to fear of periods resuming at school. Discussed age of menopause. Pt to stop POPs 6/25 when school gets out so can have all summer to see what bleeding does. Suspect aygestin thinning lining causing yellow and light brown d/c. F/u prn. Rx RF eRxd.    Menorrhagia with regular cycle - Plan: norethindrone (AYGESTIN) 5 MG tablet  Elevated lipids - Plan: Lipid panel  Screening cholesterol level - Plan: Lipid panel  Blood tests for routine general physical examination - Plan: Comprehensive metabolic panel, Hemoglobin A1c, Lipid panel, CBC with Differential/Platelet  Screening for diabetes mellitus - Plan: Hemoglobin A1c   Meds ordered this encounter  Medications   norethindrone (AYGESTIN) 5 MG tablet    Sig: Take 1 tablet (5 mg total) by mouth daily.    Dispense:  90 tablet    Refill:  2    Order Specific Question:   Supervising Provider    Answer:   Waymon Budge              GYN counsel breast self exam, mammography screening, menopause, adequate intake of calcium and vitamin D, diet and exercise     F/U  Return in about 1 year (around 04/27/2024)./ 1 yr annual   [place] B. Joeanthony Seeling, PA-C 04/28/2023 4:16 PM

## 2024-02-27 ENCOUNTER — Other Ambulatory Visit: Payer: Self-pay | Admitting: Obstetrics and Gynecology

## 2024-02-27 DIAGNOSIS — D251 Intramural leiomyoma of uterus: Secondary | ICD-10-CM

## 2024-02-27 DIAGNOSIS — N920 Excessive and frequent menstruation with regular cycle: Secondary | ICD-10-CM

## 2024-03-05 ENCOUNTER — Other Ambulatory Visit: Payer: Self-pay | Admitting: Obstetrics and Gynecology

## 2024-03-05 DIAGNOSIS — Z1231 Encounter for screening mammogram for malignant neoplasm of breast: Secondary | ICD-10-CM

## 2024-04-09 ENCOUNTER — Ambulatory Visit
Admission: RE | Admit: 2024-04-09 | Discharge: 2024-04-09 | Disposition: A | Discharge status: Home / Self Care | Payer: Self-pay | Source: Ambulatory Visit | Attending: Obstetrics and Gynecology | Admitting: Obstetrics and Gynecology

## 2024-04-09 ENCOUNTER — Encounter: Payer: Self-pay | Admitting: Radiology

## 2024-04-09 DIAGNOSIS — Z1231 Encounter for screening mammogram for malignant neoplasm of breast: Secondary | ICD-10-CM | POA: Diagnosis present

## 2024-04-10 ENCOUNTER — Ambulatory Visit: Payer: Self-pay | Admitting: Obstetrics and Gynecology

## 2024-04-30 NOTE — Progress Notes (Unsigned)
 No chief complaint on file.    HPI:      [name] is a 58 y.o. [phone] who LMP was No LMP recorded (lmp unknown). Patient is postmenopausal., presents today for her annual examination.  Her menses are absent now with aygestin , started 2022, but has daily yellow or brown d/c. Was on continuous dosing OCPs with almost daily light spotting with occas gushes with clots.  She has a very large uterus due to Within the uterus are multiple suspected fibroids. The largest fibroid seen was 9.0 cm. There were numerous 8 to 8.5 cm fibroids. Numerous 6 to 6.5 cm fibroids and downwards. (7/21 GYN u/s). Leio larger than previous u/s 2018. Normal CBC 7/21. 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 She saw Dr. Arloa in 2014 who recommended lupron to shrink the fibroids before hyst, but that was too expensive and pt not interested. Discussed ref for COLOMBIA last yr but pt didn't do it. There is concern about the size of her uterus. 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 No vasomotor sx. Pt also had an umbilical hernia that complicated the situation and this was surgically repaired emergently 10/18.   Sex activity: not sexually active. No vag sx.  Last Pap: 04/22/22 Results were: no abnormalities /neg HPV DNA . 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  FH unknown due to adoption. The patient does not do self-breast exams.   Tobacco use: The patient denies current or previous tobacco use. Alcohol use: none No drug use. Exercise: mod active   She does get adequate calcium and Vitamin D  in her diet.   She had borderline LDL and slightly elevated TGs  6/19, worse 7/21 and 8/23. Repeat Labs due but not done last yr.  Colonoscopy: positive cologuard 2022; colonoscopy done 9/22 at [facility]  with hemorrhoids, repeat due after 10 yrs   Past Medical History:  Diagnosis Date   Anemia 10/20/2011   d/t chronic blood loss   Anxiety    ASCUS with positive high risk HPV cervical 09/18/2008   ascus; hpv +   Fibroid 2014   c menorrhagia   History of mammography, diagnostic 02/24/2015; 03/12/2016   BIRAD 2; NEG   History of Papanicolaou smear of cervix 12/25/2013   -/-   Menorrhagia 10/24/2012   Positive colorectal cancer screening using Cologuard test 02/2021   Umbilical hernia 10/25/2012   REDUCIBLE    Past Surgical History:  Procedure Laterality Date   CERVICAL BIOPSY  W/ LOOP ELECTRODE EXCISION  04/04/2009   CESAREAN SECTION  1988, 1996   COLONOSCOPY  2010   COLONOSCOPY WITH PROPOFOL  N/A 05/05/2021   Procedure: COLONOSCOPY WITH PROPOFOL ;  Surgeon: Therisa Bi, MD;  Location: [facility] ENDOSCOPY;  Service: Endoscopy;  Laterality: N/A;   COLPOSCOPY  2010   CIN2   LEEP  2010   UMBILICAL HERNIA REPAIR N/A 06/20/2017   Procedure: HERNIA REPAIR UMBILICAL ADULT;  Surgeon: Desiderio Schanz, MD;  Location: [facility] ORS;  Service: General;  Laterality: N/A;    Family History  Adopted: Yes  Problem Relation Age of Onset   Breast cancer Neg Hx     Social History   Socioeconomic History   Marital status: Single    Spouse name: Not on file  Number of children: Not on file   Years of education: Not on file   Highest education level: Not on file  Occupational History   Not on file  Tobacco Use   Smoking status: Never   Smokeless tobacco: Never  Vaping Use   Vaping status: Never Used  Substance and Sexual Activity   Alcohol use: No   Drug use: No   Sexual activity: Not Currently    Birth control/protection: Pill  Other Topics Concern   Not on file  Social History Narrative   Not on file   Social Drivers of Health   Financial Resource Strain: Not on file  Food Insecurity: Not on file  Transportation Needs: Not on file  Physical Activity: Not on file  Stress: Not on file   Social Connections: Not on file  Intimate Partner Violence: Not on file    Current Outpatient Medications on File Prior to Visit  Medication Sig Dispense Refill   fexofenadine (ALLEGRA) 60 MG tablet Take 60 mg by mouth 2 (two) times daily.     fluticasone (FLONASE) 50 MCG/ACT nasal spray Place 2 sprays into both nostrils daily.     Multiple Vitamin (MULTIVITAMIN) tablet Take 1 tablet by mouth daily. 28 tablet 4   norethindrone  (AYGESTIN ) 5 MG tablet Take 1 tablet (5 mg total) by mouth daily. 90 tablet 2   Probiotic Product (PROBIOTIC-10) CAPS Take by mouth.     No current facility-administered medications on file prior to visit.    ROS:  Review of Systems  Constitutional:  Negative for fatigue, fever and unexpected weight change.  Respiratory:  Negative for cough, shortness of breath and wheezing.   Cardiovascular:  Negative for chest pain, palpitations and leg swelling.  Gastrointestinal:  Negative for blood in stool, constipation, diarrhea, nausea and vomiting.  Endocrine: Negative for cold intolerance, heat intolerance and polyuria.  Genitourinary:  Positive for vaginal bleeding. Negative for dyspareunia, dysuria, flank pain, frequency, genital sores, hematuria, menstrual problem, pelvic pain, urgency, vaginal discharge and vaginal pain.  Musculoskeletal:  Negative for back pain, joint swelling and myalgias.  Skin:  Negative for rash.  Neurological:  Negative for dizziness, syncope, light-headedness, numbness and headaches.  Hematological:  Negative for adenopathy.  Psychiatric/Behavioral:  Negative for agitation, confusion, sleep disturbance and suicidal ideas. The patient is not nervous/anxious.      Objective: LMP  (LMP Unknown)    Physical Exam Constitutional:      Appearance: She is well-developed.  Genitourinary:     Vulva normal.     Right Labia: No rash, tenderness or lesions.    Left Labia: No tenderness, lesions or rash.    No vaginal discharge, erythema or  tenderness.      Right Adnexa: not tender and no mass present.    Left Adnexa: not tender and no mass present.    No cervical friability or polyp.     Uterus is enlarged.     Uterus is not tender.     Uterine mass present. Breasts:    Right: No mass, nipple discharge, skin change or tenderness.     Left: No mass, nipple discharge, skin change or tenderness.  Neck:     Thyroid: No thyromegaly.  Cardiovascular:     Rate and Rhythm: Normal rate and regular rhythm.     Heart sounds: Normal heart sounds. No murmur heard. Pulmonary:     Effort: Pulmonary effort is normal.     Breath sounds: Normal breath sounds.  Abdominal:  Palpations: Abdomen is soft.     Tenderness: There is no abdominal tenderness. There is no guarding or rebound.   Musculoskeletal:        General: Normal range of motion.     Cervical back: Normal range of motion.  Lymphadenopathy:     Cervical: No cervical adenopathy.  Neurological:     General: No focal deficit present.     Mental Status: She is alert and oriented to person, place, and time.     Cranial Nerves: No cranial nerve deficit.  Skin:    General: Skin is warm and dry.  Psychiatric:        Mood and Affect: Mood normal.        Behavior: Behavior normal.        Thought Content: Thought content normal.        Judgment: Judgment normal.  Vitals reviewed.     Assessment/Plan: Encounter for annual routine gynecological examination  Encounter for screening mammogram for malignant neoplasm of breast; pt current on mammo  Intramural leiomyoma of uterus - Plan: norethindrone  (AYGESTIN ) 5 MG tablet; pt not interested in tx. Still has daily yellow d/c to light brown d/c. Wants to cont aygestin  for now due to fear of periods resuming at school. Discussed age of menopause. Pt to stop POPs 6/25 when school gets out so can have all summer to see what bleeding does. Suspect aygestin  thinning lining causing yellow and light brown d/c. F/u prn. Rx RF eRxd.    Menorrhagia with regular cycle - Plan: norethindrone  (AYGESTIN ) 5 MG tablet  Elevated lipids - Plan: Lipid panel  Screening cholesterol level - Plan: Lipid panel  Blood tests for routine general physical examination - Plan: Comprehensive metabolic panel, Hemoglobin A1c, Lipid panel, CBC with Differential/Platelet  Screening for diabetes mellitus - Plan: Hemoglobin A1c   No orders of the defined types were placed in this encounter.             GYN counsel breast self exam, mammography screening, menopause, adequate intake of calcium and vitamin D , diet and exercise     F/U  No follow-ups on file./ 1 yr annual   [place] B. Briahnna Harries, PA-C 04/30/2024 5:45 PM

## 2024-05-01 ENCOUNTER — Ambulatory Visit (INDEPENDENT_AMBULATORY_CARE_PROVIDER_SITE_OTHER): Payer: Self-pay | Admitting: Obstetrics and Gynecology

## 2024-05-01 ENCOUNTER — Encounter: Payer: Self-pay | Admitting: Obstetrics and Gynecology

## 2024-05-01 VITALS — BP 132/88 | HR 72 | Ht 63.0 in | Wt 168.0 lb

## 2024-05-01 DIAGNOSIS — Z131 Encounter for screening for diabetes mellitus: Secondary | ICD-10-CM

## 2024-05-01 DIAGNOSIS — Z1231 Encounter for screening mammogram for malignant neoplasm of breast: Secondary | ICD-10-CM

## 2024-05-01 DIAGNOSIS — D251 Intramural leiomyoma of uterus: Secondary | ICD-10-CM | POA: Diagnosis not present

## 2024-05-01 DIAGNOSIS — N920 Excessive and frequent menstruation with regular cycle: Secondary | ICD-10-CM

## 2024-05-01 DIAGNOSIS — Z0000 Encounter for general adult medical examination without abnormal findings: Secondary | ICD-10-CM

## 2024-05-01 DIAGNOSIS — Z3041 Encounter for surveillance of contraceptive pills: Secondary | ICD-10-CM

## 2024-05-01 DIAGNOSIS — E785 Hyperlipidemia, unspecified: Secondary | ICD-10-CM

## 2024-05-01 DIAGNOSIS — Z01419 Encounter for gynecological examination (general) (routine) without abnormal findings: Secondary | ICD-10-CM

## 2024-05-01 DIAGNOSIS — Z13220 Encounter for screening for lipoid disorders: Secondary | ICD-10-CM

## 2024-05-01 NOTE — Patient Instructions (Signed)
 I value your feedback and you entrusting [place] with your care. If you get a [place] patient survey, I would appreciate you taking the time to let us know about your experience today. Thank you! ? ? ?

## 2024-05-02 ENCOUNTER — Ambulatory Visit: Payer: Self-pay | Admitting: Obstetrics and Gynecology

## 2024-05-02 LAB — LIPID PANEL
Chol/HDL Ratio: 5.6 ratio — ABNORMAL HIGH (ref 0.0–4.4)
Cholesterol, Total: 246 mg/dL — ABNORMAL HIGH (ref 100–199)
HDL: 44 mg/dL (ref >39)
LDL Chol Calc (NIH): 179 mg/dL — ABNORMAL HIGH (ref 0–99)
Triglycerides: 125 mg/dL (ref 0–149)
VLDL Cholesterol Cal: 23 mg/dL (ref 5–40)

## 2024-05-02 LAB — CBC WITH DIFFERENTIAL/PLATELET
Basophils Absolute: 0 x10E3/uL (ref 0.0–0.2)
Basos: 1 %
EOS (ABSOLUTE): 0.2 x10E3/uL (ref 0.0–0.4)
Eos: 4 %
Hematocrit: 41.6 % (ref 34.0–46.6)
Hemoglobin: 13.4 g/dL (ref 11.1–15.9)
Immature Grans (Abs): 0 x10E3/uL (ref 0.0–0.1)
Immature Granulocytes: 0 %
Lymphocytes Absolute: 1.4 x10E3/uL (ref 0.7–3.1)
Lymphs: 30 %
MCH: 30.9 pg (ref 26.6–33.0)
MCHC: 32.2 g/dL (ref 31.5–35.7)
MCV: 96 fL (ref 79–97)
Monocytes Absolute: 0.4 x10E3/uL (ref 0.1–0.9)
Monocytes: 8 %
Neutrophils Absolute: 2.7 x10E3/uL (ref 1.4–7.0)
Neutrophils: 57 %
Platelets: 247 x10E3/uL (ref 150–450)
RBC: 4.33 x10E6/uL (ref 3.77–5.28)
RDW: 12.8 % (ref 11.7–15.4)
WBC: 4.8 x10E3/uL (ref 3.4–10.8)

## 2024-05-02 LAB — COMPREHENSIVE METABOLIC PANEL WITH GFR
ALT: 30 IU/L (ref 0–32)
AST: 26 IU/L (ref 0–40)
Albumin: 4.6 g/dL (ref 3.8–4.9)
Alkaline Phosphatase: 77 IU/L (ref 44–121)
BUN/Creatinine Ratio: 20 (ref 9–23)
BUN: 12 mg/dL (ref 6–24)
Bilirubin Total: 0.4 mg/dL (ref 0.0–1.2)
CO2: 22 mmol/L (ref 20–29)
Calcium: 9.7 mg/dL (ref 8.7–10.2)
Chloride: 101 mmol/L (ref 96–106)
Creatinine, Ser: 0.59 mg/dL (ref 0.57–1.00)
Globulin, Total: 2.4 g/dL (ref 1.5–4.5)
Glucose: 81 mg/dL (ref 70–99)
Potassium: 4 mmol/L (ref 3.5–5.2)
Sodium: 139 mmol/L (ref 134–144)
Total Protein: 7 g/dL (ref 6.0–8.5)
eGFR: 106 mL/min/1.73 (ref >59)

## 2024-05-02 LAB — HEMOGLOBIN A1C
Est. average glucose Bld gHb Est-mCnc: 105 mg/dL
Hgb A1c MFr Bld: 5.3 % (ref 4.8–5.6)
